# Patient Record
Sex: Female | Born: 1970 | Race: Black or African American | Hispanic: No | Marital: Single
Health system: Southern US, Community
[De-identification: ages and names within clinical notes are randomized; demographics above are authoritative.]

## PROBLEM LIST (undated history)

## (undated) DIAGNOSIS — I10 Essential (primary) hypertension: Secondary | ICD-10-CM

## (undated) HISTORY — PX: CHOLECYSTECTOMY: SHX55

## (undated) HISTORY — PX: TUBAL LIGATION: SHX77

---

## 2005-08-15 ENCOUNTER — Emergency Department (HOSPITAL_COMMUNITY): Admission: EM | Admit: 2005-08-15 | Discharge: 2005-08-15 | Payer: Self-pay | Admitting: Family Medicine

## 2005-08-16 ENCOUNTER — Ambulatory Visit: Payer: Self-pay | Admitting: Family Medicine

## 2005-09-06 ENCOUNTER — Ambulatory Visit: Payer: Self-pay | Admitting: Family Medicine

## 2006-04-14 ENCOUNTER — Emergency Department (HOSPITAL_COMMUNITY): Admission: EM | Admit: 2006-04-14 | Discharge: 2006-04-15 | Payer: Self-pay | Admitting: Emergency Medicine

## 2006-05-01 ENCOUNTER — Emergency Department (HOSPITAL_COMMUNITY): Admission: EM | Admit: 2006-05-01 | Discharge: 2006-05-01 | Payer: Self-pay | Admitting: Family Medicine

## 2006-12-06 ENCOUNTER — Emergency Department (HOSPITAL_COMMUNITY): Admission: EM | Admit: 2006-12-06 | Discharge: 2006-12-06 | Payer: Self-pay | Admitting: Emergency Medicine

## 2006-12-27 ENCOUNTER — Ambulatory Visit: Payer: Self-pay | Admitting: Family Medicine

## 2006-12-28 ENCOUNTER — Encounter (INDEPENDENT_AMBULATORY_CARE_PROVIDER_SITE_OTHER): Payer: Self-pay | Admitting: Family Medicine

## 2008-03-01 ENCOUNTER — Emergency Department (HOSPITAL_COMMUNITY): Admission: EM | Admit: 2008-03-01 | Discharge: 2008-03-01 | Payer: Self-pay | Admitting: Emergency Medicine

## 2008-05-20 ENCOUNTER — Emergency Department (HOSPITAL_COMMUNITY): Admission: EM | Admit: 2008-05-20 | Discharge: 2008-05-20 | Payer: Self-pay | Admitting: Emergency Medicine

## 2009-01-10 ENCOUNTER — Emergency Department: Payer: Self-pay | Admitting: Emergency Medicine

## 2009-05-24 ENCOUNTER — Ambulatory Visit: Payer: Self-pay | Admitting: Family Medicine

## 2009-08-28 ENCOUNTER — Emergency Department: Payer: Self-pay | Admitting: Emergency Medicine

## 2010-07-11 LAB — POCT I-STAT, CHEM 8
BUN: 6 mg/dL (ref 6–23)
Chloride: 105 mEq/L (ref 96–112)
Creatinine, Ser: 0.8 mg/dL (ref 0.4–1.2)
Hemoglobin: 13.6 g/dL (ref 12.0–15.0)
Potassium: 3.8 mEq/L (ref 3.5–5.1)
Sodium: 142 mEq/L (ref 135–145)

## 2010-07-13 ENCOUNTER — Emergency Department: Payer: Self-pay | Admitting: Internal Medicine

## 2010-12-29 LAB — URINALYSIS, ROUTINE W REFLEX MICROSCOPIC
Glucose, UA: NEGATIVE mg/dL
Hgb urine dipstick: NEGATIVE
Specific Gravity, Urine: 1.017 (ref 1.005–1.030)
Urobilinogen, UA: 0.2 mg/dL (ref 0.0–1.0)
pH: 7 (ref 5.0–8.0)

## 2011-04-04 ENCOUNTER — Encounter (HOSPITAL_COMMUNITY): Payer: Self-pay | Admitting: *Deleted

## 2011-04-04 HISTORY — DX: Essential (primary) hypertension: I10

## 2011-07-10 ENCOUNTER — Emergency Department (HOSPITAL_COMMUNITY): Payer: Medicaid Other

## 2011-07-10 ENCOUNTER — Emergency Department (HOSPITAL_COMMUNITY)
Admission: EM | Admit: 2011-07-10 | Discharge: 2011-07-10 | Disposition: A | Discharge status: Home / Self Care | Payer: Medicaid Other | Attending: Emergency Medicine | Admitting: Emergency Medicine

## 2011-07-10 ENCOUNTER — Encounter (HOSPITAL_COMMUNITY): Payer: Self-pay

## 2011-07-10 DIAGNOSIS — I10 Essential (primary) hypertension: Secondary | ICD-10-CM | POA: Insufficient documentation

## 2011-07-10 DIAGNOSIS — R51 Headache: Secondary | ICD-10-CM | POA: Insufficient documentation

## 2011-07-10 DIAGNOSIS — E7800 Pure hypercholesterolemia, unspecified: Secondary | ICD-10-CM | POA: Insufficient documentation

## 2011-07-10 DIAGNOSIS — G5600 Carpal tunnel syndrome, unspecified upper limb: Secondary | ICD-10-CM | POA: Insufficient documentation

## 2011-07-10 MED ORDER — DIPHENHYDRAMINE HCL 50 MG/ML IJ SOLN
25.0000 mg | Freq: Once | INTRAMUSCULAR | Status: AC
  Administered 2011-07-10: 25 mg via INTRAVENOUS
  Filled 2011-07-10: qty 1

## 2011-07-10 MED ORDER — DICLOFENAC SODIUM 75 MG PO TBEC: 75.0000 mg | DELAYED_RELEASE_TABLET | Freq: Two times a day (BID) | ORAL | Status: AC

## 2011-07-10 MED ORDER — DEXAMETHASONE SODIUM PHOSPHATE 10 MG/ML IJ SOLN
10.0000 mg | Freq: Once | INTRAMUSCULAR | Status: AC
  Administered 2011-07-10: 10 mg via INTRAVENOUS
  Filled 2011-07-10: qty 1

## 2011-07-10 MED ORDER — METOCLOPRAMIDE HCL 5 MG/ML IJ SOLN
10.0000 mg | Freq: Once | INTRAMUSCULAR | Status: AC
  Administered 2011-07-10: 10 mg via INTRAVENOUS
  Filled 2011-07-10: qty 2

## 2011-07-10 MED ORDER — KETOROLAC TROMETHAMINE 30 MG/ML IJ SOLN
30.0000 mg | Freq: Once | INTRAMUSCULAR | Status: AC
  Administered 2011-07-10: 30 mg via INTRAVENOUS
  Filled 2011-07-10: qty 1

## 2011-07-10 MED ORDER — SODIUM CHLORIDE 0.9 % IV BOLUS (SEPSIS)
1000.0000 mL | Freq: Once | INTRAVENOUS | Status: AC
  Administered 2011-07-10: 1000 mL via INTRAVENOUS

## 2011-07-10 NOTE — Progress Notes (Signed)
Orthopedic Tech Progress Note Patient Details:  Janet Casey [birth year] [id number]  Type of Splint: Other (comment) Splint Location: Left wrist Splint Interventions: Application    Janet Casey 07/10/2011, 10:16 AM

## 2011-07-10 NOTE — ED Provider Notes (Signed)
History     CSN: [id number]  Arrival date & time 07/10/11  0844   First MD Initiated Contact with Patient 07/10/11 0857      9:34 AM HPI Reports yesterday at work she began having a posterior headache. States HA was persistent all day. States this morning she still had a headache but it moved to the left frontal area. Describes pain as throbbing. Associated with hand and wrist pain and numbness. States she thinks her hand and wrist are carpal tunnel. 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 Reports wrist hand pain have on going. Denies neck pain, fever, weakness, aphasia, ataxia, sore throat, cough Patient is a 41 y.o. female presenting with headaches. The history is provided by the patient.  Headache  This is a new problem. The current episode started yesterday. The problem occurs constantly. The headache is associated with nothing. The pain is located in the frontal region. The quality of the pain is described as throbbing. The pain is severe. Pertinent negatives include no fever, no palpitations, no shortness of breath, no nausea and no vomiting. She has tried NSAIDs for the symptoms. The treatment provided no relief.    Past Medical History  Diagnosis Date  . Hypertension   . Hypercholesteremia     Past Surgical History  Procedure Date  . Cholecystectomy   . Tubal ligation     No family history on file.  History  Substance Use Topics  . Smoking status: Passive Smoker  . Smokeless tobacco: Not on file  . Alcohol Use: Yes     ocassional    OB History    Grav Para Term Preterm Abortions TAB SAB Ect Mult Living                  Review of Systems  Constitutional: Negative for fever and chills.  HENT: Negative for congestion, sore throat, rhinorrhea, trouble swallowing, neck pain, neck stiffness, postnasal drip and sinus pressure.   Respiratory: Negative for cough and shortness of breath.   Cardiovascular: Negative for palpitations.  Gastrointestinal: Negative  for nausea and vomiting.  Musculoskeletal: Negative for back pain.       Wrist and hand pain and numbness  Neurological: Positive for headaches. Negative for dizziness, seizures, speech difficulty, weakness, light-headedness and numbness.  All other systems reviewed and are negative.    Allergies  Review of patient's allergies indicates no known allergies.  Home Medications   Current Outpatient Rx  Name Route Sig Dispense Refill  . CARVEDILOL 6.25 MG PO TABS Oral Take 6.25 mg by mouth 2 (two) times daily with a meal.    . TRIAMTERENE-HCTZ 37.5-25 MG PO TABS Oral Take 1 tablet by mouth daily.      BP 156/90  Pulse 66  Temp(Src) 97.6 F (36.4 C) (Oral)  Resp 18  SpO2 99%  Physical Exam  Vitals reviewed. Constitutional: She is oriented to person, place, and time. Vital signs are normal. She appears well-developed and well-nourished.  HENT:  Head: Normocephalic and atraumatic.  Eyes: Conjunctivae are normal. Pupils are equal, round, and reactive to light.  Neck: Normal range of motion. Neck supple.  Cardiovascular: Normal rate, regular rhythm and normal heart sounds.  Exam reveals no friction rub.   No murmur heard. Pulmonary/Chest: Effort normal and breath sounds normal. She has no wheezes. She has no rhonchi. She has no rales. She exhibits no tenderness.  Musculoskeletal: Normal range of motion.       Left wrist: She exhibits  normal range of motion, no tenderness, no bony tenderness, no swelling, no effusion, no crepitus, no deformity and no laceration.       Patient has a positive Phalen's and Tinel's test. Cap refill <2 seconds  Neurological: She is alert and oriented to person, place, and time. No cranial nerve deficit. She exhibits normal muscle tone. Coordination normal.  Skin: Skin is warm and dry. No rash noted. No erythema. No pallor.    ED Course  Procedures  Results for orders placed during the hospital encounter of 05/20/08  POCT I-STAT, CHEM 8      Component  Value Range   Sodium 142  135 - 145 (mEq/L)   Potassium 3.8  3.5 - 5.1 (mEq/L)   Chloride 105  96 - 112 (mEq/L)   BUN 6  6 - 23 (mg/dL)   Creatinine, Ser 0.8  0.4 - 1.2 (mg/dL)   Glucose, Bld 95  70 - 99 (mg/dL)   Calcium, Ion 1.61  0.96 - 1.32 (mmol/L)   TCO2 27  0 - 100 (mmol/L)   Hemoglobin 13.6  12.0 - 15.0 (g/dL)   HCT 04.5  40.9 - 81.1 (%)   Ct Head Wo Contrast  07/10/2011  *RADIOLOGY REPORT*  Clinical Data: Headache.  Numbness in the left hand.  CT HEAD WITHOUT CONTRAST  Technique:  Contiguous axial images were obtained from the base of the skull through the vertex without contrast.  Comparison: None.  Findings: The brain stem, cerebellum, cerebral peduncles, thalami, basal ganglia, basilar cisterns, and ventricular system appear unremarkable.  No intracranial hemorrhage, mass lesion, or acute infarction is identified.  IMPRESSION:  No significant abnormality identified.  Original Report Authenticated By: Dellia Cloud, M.D.     MDM  Pt's pain is a 0/10 now Ready for d/c        Thomasene Lot, PA-C 07/10/11 1132

## 2011-07-10 NOTE — ED Notes (Signed)
Pt complains of headache and then numbness in left had, sts may be migraine or bp.

## 2011-07-10 NOTE — ED Notes (Signed)
PA back at bedside.

## 2011-07-10 NOTE — ED Notes (Signed)
PA at bedside.

## 2011-07-10 NOTE — Discharge Instructions (Signed)
Headache, General, Unknown Cause The specific cause of your headache may not have been found today. There are many causes and types of headache. A few common ones are:  Tension headache.   Migraine.   Infections (examples: dental and sinus infections).   Bone and/or joint problems in the neck or jaw.   Depression.   Eye problems.  These headaches are not life threatening.  Headaches can sometimes be diagnosed by a patient history and a physical exam. Sometimes, lab and imaging studies (such as x-ray and/or CT scan) are used to rule out more serious problems. In some cases, a spinal tap (lumbar puncture) may be requested. There are many times when your exam and tests may be normal on the first visit even when there is a serious problem causing your headaches. Because of that, it is very important to follow up with your doctor or local clinic for further evaluation. FINDING OUT THE RESULTS OF TESTS  If a radiology test was performed, a radiologist will review your results.   You will be contacted by the emergency department or your physician if any test results require a change in your treatment plan.   Not all test results may be available during your visit. If your test results are not back during the visit, make an appointment with your caregiver to find out the results. Do not assume everything is normal if you have not heard from your caregiver or the medical facility. It is important for you to follow up on all of your test results.  HOME CARE INSTRUCTIONS   Keep follow-up appointments with your caregiver, or any specialist referral.   Only take over-the-counter or prescription medicines for pain, discomfort, or fever as directed by your caregiver.   Biofeedback, massage, or other relaxation techniques may be helpful.   Ice packs or heat applied to the head and neck can be used. Do this three to four times per day, or as needed.   Call your doctor if you have any questions or  concerns.   If you smoke, you should quit.  SEEK MEDICAL CARE IF:   You develop problems with medications prescribed.   You do not respond to or obtain relief from medications.   You have a change from the usual headache.   You develop nausea or vomiting.  SEEK IMMEDIATE MEDICAL CARE IF:   If your headache becomes severe.   You have an unexplained oral temperature above 102 F (38.9 C), or as your caregiver suggests.   You have a stiff neck.   You have loss of vision.   You have muscular weakness.   You have loss of muscular control.   You develop severe symptoms different from your first symptoms.   You start losing your balance or have trouble walking.   You feel faint or pass out.  MAKE SURE YOU:   Understand these instructions.   Will watch your condition.   Will get help right away if you are not doing well or get worse.  Document Released: 03/12/2005 Document Revised: 03/01/2011 Document Reviewed: 10/30/2007 [facility] Patient Information 2012 [place], [place].Carpal Tunnel Syndrome The carpal tunnel is a narrow hollow area in the wrist. It is formed by the wrist bones and ligaments. Nerves, blood vessels, and tendons (cord like structures which attach muscle to bone) on the palm side (the side of your hand in the direction your fingers bend) of your hand pass through the carpal tunnel. Repeated wrist motion or certain diseases may  cause swelling within the tunnel. (That is why these are called repetitive trauma (damage caused by over use) disorders. It is also a common problem in late pregnancy.) This swelling pinches the main nerve in the wrist (median nerve) and causes the painful condition called carpal tunnel syndrome. A feeling of "pins and needles" may be noticed in the fingers or hand; however, the entire arm may ache from this condition. Carpal tunnel syndrome may clear up by itself. Cortisone injections may help. Sometimes, an operation may be needed to free the  pinched nerve. An electromyogram (a type of test) may be needed to confirm this diagnosis (learning what is wrong). This is a test which measures nerve conduction. The nerve conduction is usually slowed in a carpal tunnel syndrome. HOME CARE INSTRUCTIONS   If your caregiver prescribed medication to help reduce swelling, take as directed.   If you were given a splint to keep your wrist from bending, use it as instructed. It is important to wear the splint at night. Use the splint for as long as you have pain or numbness in your hand, arm or wrist. This may take 1 to 2 months.   If you have pain at night, it may help to rub or shake your hand, or elevate your hand above the level of your heart (the center of your chest).   It is important to give your wrist a rest by stopping the activities that are causing the problem. If your symptoms (problems) are work-related, you may need to talk to your employer about changing to a job that does not require using your wrist.   Only take over-the-counter or prescription medicines for pain, discomfort, or fever as directed by your caregiver.   Following periods of extended use, particularly strenuous use, apply an ice pack wrapped in a towel to the anterior (palm) side of the affected wrist for 20 to 30 minutes. Repeat as needed three to four times per day. This will help reduce the swelling.   Follow all instructions for follow-up with your caregiver. This includes any orthopedic referrals, physical therapy, and rehabilitation. Any delay in obtaining necessary care could result in a delay or failure of your condition to heal.  SEEK IMMEDIATE MEDICAL CARE IF:   You are still having pain and numbness following a week of treatment.   You develop new, unexplained symptoms.   Your current symptoms are getting worse and are not helped or controlled with medications.  MAKE SURE YOU:   Understand these instructions.   Will watch your condition.   Will get  help right away if you are not doing well or get worse.  Document Released: 03/09/2000 Document Revised: 03/01/2011 Document Reviewed: 01/26/2011 [facility] Patient Information 2012 [place], [place].

## 2011-07-13 NOTE — ED Provider Notes (Signed)
Medical screening examination/treatment/procedure(s) were conducted as a shared visit with non-physician practitioner(s) and myself.  I personally evaluated the patient during the encounter.  Headache without neuro deficits or stiff neck  Donnetta Hutching, MD 07/13/11 1423

## 2012-08-06 ENCOUNTER — Emergency Department (HOSPITAL_COMMUNITY)
Admission: EM | Admit: 2012-08-06 | Discharge: 2012-08-06 | Disposition: A | Discharge status: Home / Self Care | Payer: Medicaid Other | Attending: Emergency Medicine | Admitting: Emergency Medicine

## 2012-08-06 ENCOUNTER — Emergency Department (HOSPITAL_COMMUNITY): Payer: Medicaid Other

## 2012-08-06 ENCOUNTER — Encounter (HOSPITAL_COMMUNITY): Payer: Self-pay | Admitting: Emergency Medicine

## 2012-08-06 DIAGNOSIS — S8990XA Unspecified injury of unspecified lower leg, initial encounter: Secondary | ICD-10-CM | POA: Insufficient documentation

## 2012-08-06 DIAGNOSIS — Y9301 Activity, walking, marching and hiking: Secondary | ICD-10-CM | POA: Insufficient documentation

## 2012-08-06 DIAGNOSIS — M25561 Pain in right knee: Secondary | ICD-10-CM

## 2012-08-06 DIAGNOSIS — Y929 Unspecified place or not applicable: Secondary | ICD-10-CM | POA: Insufficient documentation

## 2012-08-06 DIAGNOSIS — X500XXA Overexertion from strenuous movement or load, initial encounter: Secondary | ICD-10-CM | POA: Insufficient documentation

## 2012-08-06 DIAGNOSIS — I10 Essential (primary) hypertension: Secondary | ICD-10-CM | POA: Insufficient documentation

## 2012-08-06 DIAGNOSIS — Z79899 Other long term (current) drug therapy: Secondary | ICD-10-CM | POA: Insufficient documentation

## 2012-08-06 DIAGNOSIS — S99929A Unspecified injury of unspecified foot, initial encounter: Secondary | ICD-10-CM | POA: Insufficient documentation

## 2012-08-06 MED ORDER — IBUPROFEN 800 MG PO TABS: 800.0000 mg | ORAL_TABLET | Freq: Three times a day (TID) | ORAL | Status: DC

## 2012-08-06 MED ORDER — OXYCODONE-ACETAMINOPHEN 5-325 MG PO TABS: ORAL_TABLET | ORAL | Status: DC

## 2012-08-06 NOTE — ED Notes (Signed)
BIB self. C/o walking dogs 2-3 weeks ago, dogs had pulled in a lateral direction against right knee. Patient states she thinks knee "popped" at that time. Patient has been worsening recently. Numbness to lateral right thigh. Intact sensation to right toes. Patient ambulated to triage. No other complaints

## 2012-08-06 NOTE — ED Provider Notes (Signed)
History    This chart was scribed for non-physician practitioner Junius Finner working with Celene Kras, MD by Quintella Reichert, ED Scribe. This patient was seen in room TR07C/TR07C and the patient's care was started at 12:00 AM .   CSN: [id number]  Arrival date & time 08/06/12  1645      Chief Complaint  Patient presents with  . Knee Pain     The history is provided by the patient. No language interpreter was used.    HPI Comments: Janet Casey is a 42 y.o. female who presents to the Emergency Department complaining of constant, moderate right knee pain that began 3 weeks ago when pt was walking her dogs and they pulled her in 2 different directions.  Pt describes pain in knee as sharp and aching, and states it is exacerbated by bearing weight and by bending the knee.  It is not relieved by elevation or ibuprofen.  Pt is ambulatory with pain.  She denies past h/o injury or pain to the area.  Pt also notes pain in the muscles behind her right knee that began more recently, extending from her thigh down to the middle of her calf.  Pt states she is allergic to Vicodin and tramadol (make her jittery).  She states that she is not allergic to Percocet.   Past Medical History  Diagnosis Date  . Hypertension     History reviewed. No pertinent past surgical history.  History reviewed. No pertinent family history.  History  Substance Use Topics  . Smoking status: Passive Smoke Exposure - Never Smoker  . Smokeless tobacco: Not on file  . Alcohol Use: Yes     Comment: ocassional    OB History   Grav Para Term Preterm Abortions TAB SAB Ect Mult Living                  Review of Systems  All other systems reviewed and are negative.    Allergies  Tramadol and Vicodin  Home Medications   Current Outpatient Rx  Name  Route  Sig  Dispense  Refill  . carvedilol (COREG) 6.25 MG tablet   Oral   Take 6.25 mg by mouth 2 (two) times daily with a meal.         .  triamterene-hydrochlorothiazide (MAXZIDE-25) 37.5-25 MG per tablet   Oral   Take 1 tablet by mouth daily.         Marland Kitchen ibuprofen (ADVIL,MOTRIN) 800 MG tablet   Oral   Take 1 tablet (800 mg total) by mouth 3 (three) times daily.   21 tablet   0   . oxyCODONE-acetaminophen (PERCOCET/ROXICET) 5-325 MG per tablet      Take 1-2 pills every 4-6 hours as needed for pain   6 tablet   0     BP 151/84  Pulse 89  Temp(Src) 99.2 F (37.3 C)  Resp 17  SpO2 98%  LMP 07/16/2012  Physical Exam  Nursing note and vitals reviewed. Constitutional: She is oriented to person, place, and time. She appears well-developed and well-nourished. No distress.  HENT:  Head: Normocephalic and atraumatic.  Eyes: Conjunctivae and EOM are normal.  Neck: Normal range of motion. Neck supple. No tracheal deviation present.  Cardiovascular: Normal rate, regular rhythm and normal heart sounds.   No murmur heard. Pulmonary/Chest: Effort normal and breath sounds normal. No respiratory distress. She has no wheezes. She has no rales.  Abdominal: Soft. There is no tenderness.  Musculoskeletal: Normal range  of motion. She exhibits edema (Mild) and tenderness ( moderate).  Right knee: mild edema, tenderness to palpation on medial and lateral aspects of knee.   Pain with weight-bearing.  Flexion limited by pain.  Neurological: She is alert and oriented to person, place, and time. Coordination normal.  Skin: Skin is warm and dry. No rash noted.  Psychiatric: She has a normal mood and affect. Her behavior is normal.    ED Course  Procedures (including critical care time)  DIAGNOSTIC STUDIES: Oxygen Saturation is 98% on room air, normal by my interpretation.    COORDINATION OF CARE: 7:03 PM-Explained that x-ray ruled out fracture.  Discussed treatment plan which includes knee immobilization, crutches and f/u with orthopedist with pt at bedside and pt agreed to plan.      Labs Reviewed - No data to display Dg  Knee Complete 4 Views Right  08/06/2012   *RADIOLOGY REPORT*  Clinical Data: Knee pain.  Swelling.  RIGHT KNEE - COMPLETE 4+ VIEW  Comparison: None  Findings: Early degenerative spurring within the patellofemoral and medial compartments.  Joint spaces are maintained. No acute bony abnormality.  Specifically, no fracture, subluxation, or dislocation.  Soft tissues are intact.  No joint effusion.  IMPRESSION: No acute bony abnormality.   Original Report Authenticated By: Charlett Nose, M.D.     1. Right knee pain       MDM  Pt c/o right knee pain x2-3 weeks after having being pulled by dogs & hearing a "pop"  Pain has not improved over this time.  Has not been seen for this pain before.  PE: mild edema, moderate tenderness over medial and lateral aspects of right knee.  No warmth or erythema. Skin in tact.  Nl plain films.   Pain not relieved by ibuprofen and elevation.  Rx: percocet, ibuprofen, knee imobolizer and crutches.  Pt states she does have insurance.  Will have pt f/u with [facility] for continued knee pain.  May need further evaluation and management.   I personally performed the services described in this documentation, which was scribed in my presence. The recorded information has been reviewed and is accurate.    Junius Finner, PA-C 08/07/12 0005

## 2012-08-06 NOTE — ED Notes (Signed)
Patient discharged with instructions using the teach back method. Patient verbalizes an understanding. 

## 2012-08-07 NOTE — ED Provider Notes (Signed)
Medical screening examination/treatment/procedure(s) were performed by non-physician practitioner and as supervising physician I was immediately available for consultation/collaboration.    Ival Basquez R Brently Voorhis, MD 08/07/12 1534 

## 2012-12-03 ENCOUNTER — Emergency Department: Payer: Self-pay | Admitting: Emergency Medicine

## 2013-04-20 ENCOUNTER — Emergency Department: Payer: Self-pay | Admitting: Emergency Medicine

## 2013-04-20 LAB — CBC
HCT: 42.5 % (ref 35.0–47.0)
HGB: 13.4 g/dL (ref 12.0–16.0)
MCH: 24.6 pg — ABNORMAL LOW (ref 26.0–34.0)
MCHC: 31.6 g/dL — ABNORMAL LOW (ref 32.0–36.0)
MCV: 78 fL — AB (ref 80–100)
Platelet: 251 10*3/uL (ref 150–440)
RBC: 5.46 10*6/uL — ABNORMAL HIGH (ref 3.80–5.20)
RDW: 14.5 % (ref 11.5–14.5)
WBC: 6.3 10*3/uL (ref 3.6–11.0)

## 2013-04-20 LAB — TROPONIN I: Troponin-I: <0.02 ng/mL

## 2013-07-06 ENCOUNTER — Emergency Department (HOSPITAL_COMMUNITY)
Admission: EM | Admit: 2013-07-06 | Discharge: 2013-07-06 | Disposition: A | Discharge status: Home / Self Care | Payer: Medicaid Other | Attending: Emergency Medicine | Admitting: Emergency Medicine

## 2013-07-06 ENCOUNTER — Emergency Department (HOSPITAL_COMMUNITY): Payer: Medicaid Other

## 2013-07-06 ENCOUNTER — Encounter (HOSPITAL_COMMUNITY): Payer: Self-pay | Admitting: Emergency Medicine

## 2013-07-06 DIAGNOSIS — Z791 Long term (current) use of non-steroidal anti-inflammatories (NSAID): Secondary | ICD-10-CM | POA: Insufficient documentation

## 2013-07-06 DIAGNOSIS — W010XXA Fall on same level from slipping, tripping and stumbling without subsequent striking against object, initial encounter: Secondary | ICD-10-CM | POA: Insufficient documentation

## 2013-07-06 DIAGNOSIS — I10 Essential (primary) hypertension: Secondary | ICD-10-CM | POA: Insufficient documentation

## 2013-07-06 DIAGNOSIS — Y929 Unspecified place or not applicable: Secondary | ICD-10-CM | POA: Insufficient documentation

## 2013-07-06 DIAGNOSIS — S92502A Displaced unspecified fracture of left lesser toe(s), initial encounter for closed fracture: Secondary | ICD-10-CM

## 2013-07-06 DIAGNOSIS — S92919A Unspecified fracture of unspecified toe(s), initial encounter for closed fracture: Secondary | ICD-10-CM | POA: Insufficient documentation

## 2013-07-06 DIAGNOSIS — Y939 Activity, unspecified: Secondary | ICD-10-CM | POA: Insufficient documentation

## 2013-07-06 DIAGNOSIS — IMO0002 Reserved for concepts with insufficient information to code with codable children: Secondary | ICD-10-CM | POA: Insufficient documentation

## 2013-07-06 DIAGNOSIS — Z79899 Other long term (current) drug therapy: Secondary | ICD-10-CM | POA: Insufficient documentation

## 2013-07-06 MED ORDER — OXYCODONE-ACETAMINOPHEN 5-325 MG PO TABS: 2.0000 | ORAL_TABLET | ORAL | Status: DC | PRN

## 2013-07-06 MED ORDER — CLINDAMYCIN HCL 150 MG PO CAPS: 300.0000 mg | ORAL_CAPSULE | Freq: Three times a day (TID) | ORAL | Status: DC

## 2013-07-06 MED ORDER — OXYCODONE-ACETAMINOPHEN 5-325 MG PO TABS
2.0000 | ORAL_TABLET | Freq: Once | ORAL | Status: AC
  Administered 2013-07-06: 2 via ORAL
  Filled 2013-07-06: qty 2

## 2013-07-06 NOTE — Discharge Instructions (Signed)
Take Clindamycin as directed until gone. Take Vicodin as needed for pain. Refer to attached documents for more information. Apply ice and elevate your left foot.

## 2013-07-06 NOTE — ED Provider Notes (Signed)
CSN: [id number]     Arrival date & time 07/06/13  1207 History   First MD Initiated Contact with Patient 07/06/13 1252     Chief Complaint  Patient presents with  . Toe Injury     (Consider location/radiation/quality/duration/timing/severity/associated sxs/prior Treatment) Patient is a 43 y.o. female presenting with toe pain. The history is provided by the patient. No language interpreter was used.  Toe Pain This is a new problem. The current episode started today. The problem occurs constantly. The problem has been unchanged. Associated symptoms include arthralgias. Pertinent negatives include no abdominal pain, chest pain, chills, fatigue, fever, nausea, neck pain, vomiting or weakness. The symptoms are aggravated by walking. She has tried nothing for the symptoms. The treatment provided no relief.    Past Medical History  Diagnosis Date  . Hypertension    History reviewed. No pertinent past surgical history. History reviewed. No pertinent family history. History  Substance Use Topics  . Smoking status: Passive Smoke Exposure - Never Smoker  . Smokeless tobacco: Not on file  . Alcohol Use: Yes     Comment: ocassional   OB History   Grav Para Term Preterm Abortions TAB SAB Ect Mult Living                 Review of Systems  Constitutional: Negative for fever, chills and fatigue.  HENT: Negative for trouble swallowing.   Eyes: Negative for visual disturbance.  Respiratory: Negative for shortness of breath.   Cardiovascular: Negative for chest pain and palpitations.  Gastrointestinal: Negative for nausea, vomiting, abdominal pain and diarrhea.  Genitourinary: Negative for dysuria and difficulty urinating.  Musculoskeletal: Positive for arthralgias. Negative for neck pain.  Skin: Negative for color change.  Neurological: Negative for dizziness and weakness.  Psychiatric/Behavioral: Negative for dysphoric mood.      Allergies  Tramadol and Vicodin  Home Medications    Current Outpatient Rx  Name  Route  Sig  Dispense  Refill  . carvedilol (COREG) 6.25 MG tablet   Oral   Take 6.25 mg by mouth 2 (two) times daily with a meal.         . ibuprofen (ADVIL,MOTRIN) 800 MG tablet   Oral   Take 1 tablet (800 mg total) by mouth 3 (three) times daily.   21 tablet   0   . oxyCODONE-acetaminophen (PERCOCET/ROXICET) 5-325 MG per tablet      Take 1-2 pills every 4-6 hours as needed for pain   6 tablet   0   . triamterene-hydrochlorothiazide (MAXZIDE-25) 37.5-25 MG per tablet   Oral   Take 1 tablet by mouth daily.          BP 193/98  Pulse 88  Temp(Src) 98.3 F (36.8 C)  Resp 18  Ht 5\' 4"  (1.626 m)  Wt 226 lb 7 oz (102.711 kg)  BMI 38.85 kg/m2  SpO2 99% Physical Exam  Nursing note and vitals reviewed. Constitutional: She appears well-developed and well-nourished. No distress.  HENT:  Head: Normocephalic and atraumatic.  Eyes: Conjunctivae are normal.  Neck: Normal range of motion.  Cardiovascular: Normal rate and regular rhythm.  Exam reveals no gallop and no friction rub.   No murmur heard. Pulmonary/Chest: Effort normal and breath sounds normal. She has no wheezes. She has no rales. She exhibits no tenderness.  Musculoskeletal:  Limited ROM of left second toe due to pain and swelling. No obvious deformity. Tenderness to palpation. Bruising and overlying abrasion noted.   Neurological: She is alert.  Speech is goal-oriented. Moves limbs without ataxia.   Skin: Skin is warm and dry.  Abrasion noted to left second toe.     ED Course  Procedures (including critical care time) Labs Review Labs Reviewed - No data to display Imaging Review Dg Toe 2nd Left  07/06/2013   CLINICAL DATA:  Toe injury  EXAM: LEFT SECOND TOE  COMPARISON:  None.  FINDINGS: Cortical irregularity medial base of the head of proximal phalanx second digit. Otherwise no further evidence of fracture nor dislocation. There are findings which may represent soft tissue  swelling within the second digit.  IMPRESSION: Mach line versus nondisplaced fracture head of the proximal phalanx second digit. Correlation with physical exam, point tenderness is recommended.   Electronically Signed   By: Salome HolmesHector  Cooper M.D.   On: 07/06/2013 13:05     EKG Interpretation None      MDM   Final diagnoses:  Fracture of second toe, left, closed    1:22 PM Xray shows nondisplaced fracture of head of proximal phalanx of second digit, which correlates clinically. Patient will have vicodin for pain. Patient will have antibiotics to prevent infection due to abrasion. Vitals stable and patient afebrile.     Emilia BeckKaitlyn Rion Schnitzer, PA-C 07/06/13 1520

## 2013-07-06 NOTE — ED Provider Notes (Signed)
Medical screening examination/treatment/procedure(s) were performed by non-physician practitioner and as supervising physician I was immediately available for consultation/collaboration.  Gerhard Munchobert Ameya Vowell, MD 07/06/13 1606

## 2013-07-06 NOTE — ED Notes (Addendum)
Per pt sts she tripped over something last night in the dark and injured left second toe. sts the toe is swollen and black/blue.

## 2013-07-06 NOTE — ED Notes (Signed)
Accidentally kicked a steel object with left foot. Left second toe swollen and discolored.

## 2013-07-15 ENCOUNTER — Encounter (HOSPITAL_COMMUNITY): Payer: Self-pay | Admitting: Emergency Medicine

## 2013-07-15 ENCOUNTER — Emergency Department (HOSPITAL_COMMUNITY)
Admission: EM | Admit: 2013-07-15 | Discharge: 2013-07-15 | Disposition: A | Discharge status: Home / Self Care | Payer: Medicaid Other | Attending: Emergency Medicine | Admitting: Emergency Medicine

## 2013-07-15 DIAGNOSIS — G8911 Acute pain due to trauma: Secondary | ICD-10-CM | POA: Insufficient documentation

## 2013-07-15 DIAGNOSIS — I10 Essential (primary) hypertension: Secondary | ICD-10-CM | POA: Insufficient documentation

## 2013-07-15 DIAGNOSIS — Z792 Long term (current) use of antibiotics: Secondary | ICD-10-CM | POA: Insufficient documentation

## 2013-07-15 DIAGNOSIS — Z79899 Other long term (current) drug therapy: Secondary | ICD-10-CM | POA: Insufficient documentation

## 2013-07-15 DIAGNOSIS — M79609 Pain in unspecified limb: Secondary | ICD-10-CM | POA: Insufficient documentation

## 2013-07-15 DIAGNOSIS — S92912A Unspecified fracture of left toe(s), initial encounter for closed fracture: Secondary | ICD-10-CM

## 2013-07-15 NOTE — ED Notes (Signed)
Pt presents to department for evaluation of L second toe pain. States continued pain and swelling. States she is unable to work and needs excuse note. Pt is alert and oriented x4. 5/10 pain at present. NAD.

## 2013-07-15 NOTE — ED Provider Notes (Signed)
Medical screening examination/treatment/procedure(s) were performed by non-physician practitioner and as supervising physician I was immediately available for consultation/collaboration.   EKG Interpretation None        Kaliq Lege M Yarah Fuente, DO 07/15/13 1910 

## 2013-07-15 NOTE — ED Provider Notes (Signed)
CSN: [id number]     Arrival date & time 07/15/13  1036 History  This chart was scribed for non-physician practitioner working with Janet BeckKaitlyn Kahlil Casey, by Tana ConchStephen Methvin ED Scribe. This patient was seen in TR05C/TR05C and the patient's care was started at 11:39 AM.    Chief Complaint  Patient presents with  . Toe Pain      Patient is a 43 y.o. female presenting with toe pain. The history is provided by the patient. No language interpreter was used.  Toe Pain   HPI Comments: Janet Casey is a 43 y.o. female who presents to the Emergency Department complaining of left second toe pain, she ws seen in the ED recently for this injury. She reports that her foot and ankle swell when she stands on it.  She is worried that being at work, where she has to be on her feet for 12 hours, will aggravate her left foot. She wants a work note for the next few days.   Past Medical History  Diagnosis Date  . Hypertension    History reviewed. No pertinent past surgical history. History reviewed. No pertinent family history. History  Substance Use Topics  . Smoking status: Passive Smoke Exposure - Never Smoker  . Smokeless tobacco: Not on file  . Alcohol Use: Yes     Comment: social   OB History   Grav Para Term Preterm Abortions TAB SAB Ect Mult Living                 Review of Systems  Constitutional: Positive for activity change.  Musculoskeletal: Positive for arthralgias and joint swelling.       Left toe   All other systems reviewed and are negative.     Allergies  Tramadol and Vicodin  Home Medications   Prior to Admission medications   Medication Sig Start Date End Date Taking? Authorizing Provider  carvedilol (COREG) 6.25 MG tablet Take 12.5 mg by mouth daily.     Historical Provider, MD  clindamycin (CLEOCIN) 150 MG capsule Take 2 capsules (300 mg total) by mouth 3 (three) times daily. May dispense as 150mg  capsules 07/06/13   Janet BeckKaitlyn Dock Baccam, PA-C  oxyCODONE-acetaminophen  (PERCOCET/ROXICET) 5-325 MG per tablet Take 2 tablets by mouth every 4 (four) hours as needed for severe pain. 07/06/13   Dela Sweeny, PA-C  triamterene-hydrochlorothiazide (MAXZIDE-25) 37.5-25 MG per tablet Take 1 tablet by mouth daily.    Historical Provider, MD   BP 175/105  Pulse 77  Temp(Src) 98.6 F (37 C) (Oral)  Resp 18  SpO2 100% Physical Exam  Nursing note and vitals reviewed. Constitutional: She is oriented to person, place, and time. She appears well-developed and well-nourished.  HENT:  Head: Normocephalic and atraumatic.  Eyes: Pupils are equal, round, and reactive to light.  Neck: Normal range of motion.  Pulmonary/Chest: Effort normal.  Musculoskeletal: Normal range of motion.  Pt left foot in post-op shoe  Neurological: She is alert and oriented to person, place, and time.    ED Course  Procedures (including critical care time) DIAGNOSTIC STUDIES: Oxygen Saturation is 100% on RA, normal by my interpretation.    COORDINATION OF CARE:   11:02 AM-Discussed treatment plan which includes a work note with pt at bedside and pt agreed to plan.   Labs Review Labs Reviewed - No data to display  Imaging Review No results found.   EKG Interpretation None      MDM   Final diagnoses:  Closed fracture of left toe  I personally performed the services described in this documentation, which was scribed in my presence. The recorded information has been reviewed and is accurate.   11:46 AM Patient needs a note for work because she cannot put her foot in steel toe boots without pain. Patient will have a note for additional days off. No new injuries.     Janet BeckKaitlyn Brookelle Pellicane, PA-C 07/15/13 1147

## 2013-07-15 NOTE — ED Notes (Signed)
Pt was here 4/13 with fx'd metatarsal. States has tried to go back to work but they want her to wear steel toed shoes at work and she is unable.

## 2013-09-18 ENCOUNTER — Emergency Department (HOSPITAL_COMMUNITY)
Admission: EM | Admit: 2013-09-18 | Discharge: 2013-09-18 | Disposition: A | Discharge status: Home / Self Care | Payer: Medicaid Other | Attending: Emergency Medicine | Admitting: Emergency Medicine

## 2013-09-18 ENCOUNTER — Emergency Department (HOSPITAL_COMMUNITY): Payer: Medicaid Other

## 2013-09-18 ENCOUNTER — Encounter (HOSPITAL_COMMUNITY): Payer: Self-pay | Admitting: Emergency Medicine

## 2013-09-18 ENCOUNTER — Emergency Department (HOSPITAL_COMMUNITY)
Admission: EM | Admit: 2013-09-18 | Discharge: 2013-09-18 | Disposition: A | Discharge status: Home / Self Care | Payer: No Typology Code available for payment source | Source: Home / Self Care

## 2013-09-18 DIAGNOSIS — I10 Essential (primary) hypertension: Secondary | ICD-10-CM | POA: Insufficient documentation

## 2013-09-18 DIAGNOSIS — IMO0002 Reserved for concepts with insufficient information to code with codable children: Secondary | ICD-10-CM | POA: Insufficient documentation

## 2013-09-18 DIAGNOSIS — Z79899 Other long term (current) drug therapy: Secondary | ICD-10-CM | POA: Insufficient documentation

## 2013-09-18 DIAGNOSIS — Z792 Long term (current) use of antibiotics: Secondary | ICD-10-CM | POA: Insufficient documentation

## 2013-09-18 DIAGNOSIS — R0789 Other chest pain: Secondary | ICD-10-CM | POA: Insufficient documentation

## 2013-09-18 DIAGNOSIS — R079 Chest pain, unspecified: Secondary | ICD-10-CM | POA: Insufficient documentation

## 2013-09-18 LAB — BASIC METABOLIC PANEL
BUN: 10 mg/dL (ref 6–23)
CHLORIDE: 101 meq/L (ref 96–112)
CO2: 28 mEq/L (ref 19–32)
Calcium: 9.3 mg/dL (ref 8.4–10.5)
Creatinine, Ser: 0.84 mg/dL (ref 0.50–1.10)
GFR calc Af Amer: >90 mL/min (ref >90)
GFR, EST NON AFRICAN AMERICAN: 84 mL/min — AB (ref >90)
GLUCOSE: 134 mg/dL — AB (ref 70–99)
POTASSIUM: 3.3 meq/L — AB (ref 3.7–5.3)
SODIUM: 142 meq/L (ref 137–147)

## 2013-09-18 LAB — CBC
HEMATOCRIT: 38.6 % (ref 36.0–46.0)
HEMOGLOBIN: 12.2 g/dL (ref 12.0–15.0)
MCH: 25.3 pg — ABNORMAL LOW (ref 26.0–34.0)
MCHC: 31.6 g/dL (ref 30.0–36.0)
MCV: 79.9 fL (ref 78.0–100.0)
Platelets: 248 10*3/uL (ref 150–400)
RBC: 4.83 MIL/uL (ref 3.87–5.11)
RDW: 14.3 % (ref 11.5–15.5)
WBC: 5.7 10*3/uL (ref 4.0–10.5)

## 2013-09-18 LAB — I-STAT TROPONIN, ED
Troponin i, poc: 0.01 ng/mL (ref 0.00–0.08)
Troponin i, poc: 0.01 ng/mL (ref 0.00–0.08)

## 2013-09-18 MED ORDER — POTASSIUM CHLORIDE CRYS ER 20 MEQ PO TBCR
40.0000 meq | EXTENDED_RELEASE_TABLET | Freq: Once | ORAL | Status: AC
  Administered 2013-09-18: 40 meq via ORAL
  Filled 2013-09-18: qty 2

## 2013-09-18 NOTE — ED Provider Notes (Signed)
Date: 09/18/2013  Rate: 72  Rhythm: normal sinus rhythm  QRS Axis: left  Intervals: normal  ST/T Wave abnormalities: nonspecific T wave changes  Conduction Disutrbances:none  Narrative Interpretation: T wave inversion lead III, v4  Old EKG Reviewed: none available    Glynn OctaveStephen Bessie Boyte, MD 09/18/13 [phone]

## 2013-09-18 NOTE — ED Notes (Addendum)
Wrong patient information entered, previous note was not for this patient

## 2013-09-18 NOTE — ED Notes (Signed)
Pt in c/o central chest pain over the last few days, states pain is intermittent and will have occasional palpitations, denies other symptoms or radiation, states she had pain this morning en route to hospital but denies pain at this time, no distress noted

## 2013-09-18 NOTE — ED Provider Notes (Signed)
Medical screening examination/treatment/procedure(s) were conducted as a shared visit with non-physician practitioner(s) and myself.  I personally evaluated the patient during the encounter.  Intermittent chest pressure since yesterday, lasting a few minutes at a time. No radiation.  No SOB, diaphoresis, vomiting. TTP on sternum which reproduces pain. EKG with nonspecific T wave inversions.  Atypical for ACS or PE.   EKG Interpretation   Date/Time:  Friday September 18 2013 07:39:15 EDT Ventricular Rate:  72 PR Interval:  174 QRS Duration: 84 QT Interval:  408 QTC Calculation: 446 R Axis:   -27 Text Interpretation:  Normal sinus rhythm Minimal voltage criteria for  LVH, may be normal variant Nonspecific T wave abnormality Abnormal ECG T  wave inverion III, v4 No previous ECGs available Confirmed by Manus GunningANCOUR  MD,  STEPHEN [id number]) on 09/18/2013 9:02:55 AM       Glynn OctaveStephen Rancour, MD 09/18/13 [id number]

## 2013-09-18 NOTE — Discharge Instructions (Signed)
Please follow up with your primary care physician in 1-2 days. If you do not have one please [facility] listed above. Please follow up with Dr. Daleen SquibbWall to schedule a follow up appointment.  Please read all discharge instructions and return precautions.    Chest Pain (Nonspecific) It is often hard to give a specific diagnosis for the cause of chest pain. There is always a chance that your pain could be related to something serious, such as a heart attack or a blood clot in the lungs. You need to follow up with your health care provider for further evaluation. CAUSES   Heartburn.  Pneumonia or bronchitis.  Anxiety or stress.  Inflammation around your heart (pericarditis) or lung (pleuritis or pleurisy).  A blood clot in the lung.  A collapsed lung (pneumothorax). It can develop suddenly on its own (spontaneous pneumothorax) or from trauma to the chest.  Shingles infection (herpes zoster virus). The chest wall is composed of bones, muscles, and cartilage. Any of these can be the source of the pain.  The bones can be bruised by injury.  The muscles or cartilage can be strained by coughing or overwork.  The cartilage can be affected by inflammation and become sore (costochondritis). DIAGNOSIS  Lab tests or other studies may be needed to find the cause of your pain. Your health care provider may have you take a test called an ambulatory electrocardiogram (ECG). An ECG records your heartbeat patterns over a 24-hour period. You may also have other tests, such as:  Transthoracic echocardiogram (TTE). During echocardiography, sound waves are used to evaluate how blood flows through your heart.  Transesophageal echocardiogram (TEE).  Cardiac monitoring. This allows your health care provider to monitor your heart rate and rhythm in real time.  Holter monitor. This is a portable device that records your heartbeat and can help diagnose heart arrhythmias. It  allows your health care provider to track your heart activity for several days, if needed.  Stress tests by exercise or by giving medicine that makes the heart beat faster. TREATMENT   Treatment depends on what may be causing your chest pain. Treatment may include:  Acid blockers for heartburn.  Anti-inflammatory medicine.  Pain medicine for inflammatory conditions.  Antibiotics if an infection is present.  You may be advised to change lifestyle habits. This includes stopping smoking and avoiding alcohol, caffeine, and chocolate.  You may be advised to keep your head raised (elevated) when sleeping. This reduces the chance of acid going backward from your stomach into your esophagus. Most of the time, nonspecific chest pain will improve within 2-3 days with rest and mild pain medicine.  HOME CARE INSTRUCTIONS   If antibiotics were prescribed, take them as directed. Finish them even if you start to feel better.  For the next few days, avoid physical activities that bring on chest pain. Continue physical activities as directed.  Do not use any tobacco products, including cigarettes, chewing tobacco, or electronic cigarettes.  Avoid drinking alcohol.  Only take medicine as directed by your health care provider.  Follow your health care provider's suggestions for further testing if your chest pain does not go away.  Keep any follow-up appointments you made. If you do not go to an appointment, you could develop lasting (chronic) problems with pain. If there is any problem keeping an appointment, call to reschedule. SEEK MEDICAL CARE IF:   Your chest pain does not go away, even after treatment.  You  have a rash with blisters on your chest.  You have a fever. SEEK IMMEDIATE MEDICAL CARE IF:   You have increased chest pain or pain that spreads to your arm, neck, jaw, back, or abdomen.  You have shortness of breath.  You have an increasing cough, or you cough up blood.  You  have severe back or abdominal pain.  You feel nauseous or vomit.  You have severe weakness.  You faint.  You have chills. This is an emergency. Do not wait to see if the pain will go away. Get medical help at once. Call your local emergency services (911 in [place] Do not drive yourself to the hospital. MAKE SURE YOU:   Understand these instructions.  Will watch your condition.  Will get help right away if you are not doing well or get worse. Document Released: 12/20/2004 Document Revised: 03/17/2013 Document Reviewed: 10/16/2007 [facility] Patient Information 2015 [employer] [place]. This information is not intended to replace advice given to you by your health care provider. Make sure you discuss any questions you have with your health care provider.

## 2013-09-18 NOTE — ED Provider Notes (Signed)
CSN: [id number]     Arrival date & time 09/18/13  0756 History   First MD Initiated Contact with Patient 09/18/13 0801     Chief Complaint  Patient presents with  . Chest Pain     (Consider location/radiation/quality/duration/timing/severity/associated sxs/prior Treatment) HPI Comments: Patient is 43 yo F PMHx significant for HTN presenting to the ED for intermittent episodes (lasting between 5-15 minutes) of central chest pressure w/o radiation that began yesterday. Patient states that each episodes lasts a varying amount of time, last episode was around 8AM this morning that lasted about five minutes. States it was alleviating by "praying the pain away." Currently is pain free. Does endorse some intermittent shortness of breath with the CP. Denies any nausea, vomiting, diaphoresis, abdominal pain. PERC negative. No history of cardiac catheterizations, echocardiogram, stress testing.    Past Medical History  Diagnosis Date  . Hypertension    History reviewed. No pertinent past surgical history. History reviewed. No pertinent family history. History  Substance Use Topics  . Smoking status: Passive Smoke Exposure - Never Smoker  . Smokeless tobacco: Not on file  . Alcohol Use: Yes     Comment: social   OB History   Grav Para Term Preterm Abortions TAB SAB Ect Mult Living                 Review of Systems  Constitutional: Negative for fever and chills.  Respiratory: Positive for shortness of breath.   Cardiovascular: Positive for chest pain.  All other systems reviewed and are negative.     Allergies  Tramadol and Vicodin  Home Medications   Prior to Admission medications   Medication Sig Start Date End Date Taking? Authorizing Shaylon Gillean  carvedilol (COREG) 6.25 MG tablet Take 12.5 mg by mouth daily.    Yes Historical Ammie Warrick, MD  cetirizine (ZYRTEC) 10 MG tablet Take 10 mg by mouth daily.   Yes Historical Leslee Haueter, MD  fluticasone (FLONASE) 50 MCG/ACT nasal spray Place  1 spray into both nostrils daily.   Yes Historical Mende Biswell, MD  triamterene-hydrochlorothiazide (MAXZIDE-25) 37.5-25 MG per tablet Take 1 tablet by mouth daily.   Yes Historical Rosena Bartle, MD  clindamycin (CLEOCIN) 150 MG capsule Take 2 capsules (300 mg total) by mouth 3 (three) times daily. May dispense as 150mg  capsules 07/06/13   Emilia BeckKaitlyn Szekalski, PA-C  oxyCODONE-acetaminophen (PERCOCET/ROXICET) 5-325 MG per tablet Take 2 tablets by mouth every 4 (four) hours as needed for severe pain. 07/06/13   Kaitlyn Szekalski, PA-C   BP 127/65  Pulse 77  Temp(Src) 98.5 F (36.9 C) (Oral)  Resp 23  SpO2 99% Physical Exam  Nursing note and vitals reviewed. Constitutional: She is oriented to person, place, and time. She appears well-developed and well-nourished. No distress.  HENT:  Head: Normocephalic and atraumatic.  Right Ear: External ear normal.  Left Ear: External ear normal.  Nose: Nose normal.  Mouth/Throat: Oropharynx is clear and moist.  Eyes: Conjunctivae are normal.  Neck: Normal range of motion. Neck supple.  Cardiovascular: Normal rate, regular rhythm, normal heart sounds and intact distal pulses.   Pulmonary/Chest: Effort normal and breath sounds normal. No respiratory distress. She exhibits tenderness.  Abdominal: Soft. There is no tenderness.  Musculoskeletal: Normal range of motion. She exhibits no edema.  Neurological: She is alert and oriented to person, place, and time.  Skin: Skin is warm and dry. She is not diaphoretic.  Psychiatric: She has a normal mood and affect.    ED Course  Procedures (including critical  care time) Medications  potassium chloride SA (K-DUR,KLOR-CON) CR tablet 40 mEq (40 mEq Oral Given 09/18/13 1017)    Labs Review Labs Reviewed  CBC - Abnormal; Notable for the following:    MCH 25.3 (*)    All other components within normal limits  BASIC METABOLIC PANEL - Abnormal; Notable for the following:    Potassium 3.3 (*)    Glucose, Bld 134 (*)     GFR calc non Af Amer 84 (*)    All other components within normal limits  I-STAT TROPOININ, ED  Rosezena SensorI-STAT TROPOININ, ED    Imaging Review Dg Chest 2 View  09/18/2013   CLINICAL DATA:  Chest pain and cough  EXAM: CHEST  2 VIEW  COMPARISON:  None.  FINDINGS: The heart size and mediastinal contours are within normal limits. Both lungs are clear. The visualized skeletal structures are unremarkable.  IMPRESSION: No active cardiopulmonary disease.   Electronically Signed   By: Esperanza Heiraymond  Rubner M.D.   On: 09/18/2013 08:19     EKG Interpretation   Date/Time:  Friday September 18 2013 07:39:15 EDT Ventricular Rate:  72 PR Interval:  174 QRS Duration: 84 QT Interval:  408 QTC Calculation: 446 R Axis:   -27 Text Interpretation:  Normal sinus rhythm Minimal voltage criteria for  LVH, may be normal variant Nonspecific T wave abnormality Abnormal ECG T  wave inverion III, v4 No previous ECGs available Confirmed by Manus GunningANCOUR  MD,  STEPHEN ([id number]) on 09/18/2013 9:02:55 AM      MDM   Final diagnoses:  Chest pain, atypical    Filed Vitals:   09/18/13 1300  BP: 127/65  Pulse: 77  Temp:   Resp: 23   Afebrile, NAD, non-toxic appearing, AAOx4. Patient is to be discharged with recommendation to follow up with PCP in regards to today's hospital visit. Chest pain is not likely of cardiac or pulmonary etiology d/t presentation, perc negative, VSS, no tracheal deviation, no JVD or new murmur, RRR, breath sounds equal bilaterally, EKG without acute abnormalities, negative delta troponin, and negative CXR. Pt has been advised start to return to the ED is CP becomes exertional, associated with diaphoresis or nausea, radiates to left jaw/arm, worsens or becomes concerning in any way. Pt appears reliable for follow up and is agreeable to discharge.   Case has been discussed with and seen by Dr. Manus Gunningancour who agrees with the above plan to discharge.       Jeannetta EllisJennifer L Piepenbrink, PA-C 09/18/13 1601

## 2013-09-22 ENCOUNTER — Emergency Department: Payer: Self-pay | Admitting: Emergency Medicine

## 2013-12-20 ENCOUNTER — Emergency Department: Payer: Self-pay | Admitting: Student

## 2013-12-20 LAB — CBC
HCT: 38.2 % (ref 35.0–47.0)
HGB: 11.9 g/dL — AB (ref 12.0–16.0)
MCH: 24.5 pg — AB (ref 26.0–34.0)
MCHC: 31.2 g/dL — AB (ref 32.0–36.0)
MCV: 79 fL — ABNORMAL LOW (ref 80–100)
Platelet: 257 10*3/uL (ref 150–440)
RBC: 4.86 10*6/uL (ref 3.80–5.20)
RDW: 14.1 % (ref 11.5–14.5)
WBC: 6.7 10*3/uL (ref 3.6–11.0)

## 2013-12-20 LAB — BASIC METABOLIC PANEL
Anion Gap: 5 — ABNORMAL LOW (ref 7–16)
BUN: 11 mg/dL (ref 7–18)
CHLORIDE: 110 mmol/L — AB (ref 98–107)
CREATININE: 0.86 mg/dL (ref 0.60–1.30)
Calcium, Total: 8.8 mg/dL (ref 8.5–10.1)
Co2: 26 mmol/L (ref 21–32)
EGFR (African American): >60
Glucose: 118 mg/dL — ABNORMAL HIGH (ref 65–99)
OSMOLALITY: 282 (ref 275–301)
Potassium: 3.8 mmol/L (ref 3.5–5.1)
Sodium: 141 mmol/L (ref 136–145)

## 2013-12-20 LAB — URINALYSIS, COMPLETE
BILIRUBIN, UR: NEGATIVE
BLOOD: NEGATIVE
Glucose,UR: NEGATIVE mg/dL (ref 0–75)
Ketone: NEGATIVE
LEUKOCYTE ESTERASE: NEGATIVE
NITRITE: NEGATIVE
Ph: 6 (ref 4.5–8.0)
Protein: NEGATIVE
SPECIFIC GRAVITY: 1.018 (ref 1.003–1.030)
Squamous Epithelial: 1
WBC UR: 1 /HPF (ref 0–5)

## 2013-12-20 LAB — TROPONIN I

## 2013-12-20 LAB — HCG, QUANTITATIVE, PREGNANCY: Beta Hcg, Quant.: 2 m[IU]/mL

## 2014-03-10 ENCOUNTER — Emergency Department: Payer: Self-pay | Admitting: Emergency Medicine

## 2014-07-05 ENCOUNTER — Emergency Department (HOSPITAL_COMMUNITY)
Admission: EM | Admit: 2014-07-05 | Discharge: 2014-07-05 | Disposition: A | Discharge status: Home / Self Care | Payer: Medicaid Other | Attending: Emergency Medicine | Admitting: Emergency Medicine

## 2014-07-05 ENCOUNTER — Emergency Department (HOSPITAL_COMMUNITY): Payer: Medicaid Other

## 2014-07-05 ENCOUNTER — Encounter (HOSPITAL_COMMUNITY): Payer: Self-pay | Admitting: Emergency Medicine

## 2014-07-05 DIAGNOSIS — Z792 Long term (current) use of antibiotics: Secondary | ICD-10-CM | POA: Insufficient documentation

## 2014-07-05 DIAGNOSIS — Z7951 Long term (current) use of inhaled steroids: Secondary | ICD-10-CM | POA: Insufficient documentation

## 2014-07-05 DIAGNOSIS — S99922A Unspecified injury of left foot, initial encounter: Secondary | ICD-10-CM

## 2014-07-05 DIAGNOSIS — Y998 Other external cause status: Secondary | ICD-10-CM | POA: Insufficient documentation

## 2014-07-05 DIAGNOSIS — I159 Secondary hypertension, unspecified: Secondary | ICD-10-CM | POA: Insufficient documentation

## 2014-07-05 DIAGNOSIS — Y9389 Activity, other specified: Secondary | ICD-10-CM | POA: Insufficient documentation

## 2014-07-05 DIAGNOSIS — S93509A Unspecified sprain of unspecified toe(s), initial encounter: Secondary | ICD-10-CM

## 2014-07-05 DIAGNOSIS — Y9289 Other specified places as the place of occurrence of the external cause: Secondary | ICD-10-CM | POA: Insufficient documentation

## 2014-07-05 DIAGNOSIS — S93505A Unspecified sprain of left lesser toe(s), initial encounter: Secondary | ICD-10-CM | POA: Insufficient documentation

## 2014-07-05 DIAGNOSIS — Z79899 Other long term (current) drug therapy: Secondary | ICD-10-CM | POA: Insufficient documentation

## 2014-07-05 DIAGNOSIS — W108XXA Fall (on) (from) other stairs and steps, initial encounter: Secondary | ICD-10-CM | POA: Insufficient documentation

## 2014-07-05 MED ORDER — OXYCODONE-ACETAMINOPHEN 5-325 MG PO TABS: 1.0000 | ORAL_TABLET | Freq: Three times a day (TID) | ORAL | Status: DC | PRN

## 2014-07-05 MED ORDER — CARVEDILOL 6.25 MG PO TABS: 12.5000 mg | ORAL_TABLET | Freq: Every day | ORAL | Status: AC

## 2014-07-05 NOTE — ED Notes (Signed)
Pt c/o left foot pain after slipping on stairs; pt sts pain in toes

## 2014-07-05 NOTE — Discharge Instructions (Signed)
Hypertension °Hypertension, commonly called high blood pressure, is when the force of blood pumping through your arteries is too strong. Your arteries are the blood vessels that carry blood from your heart throughout your body. A blood pressure reading consists of a higher number over a lower number, such as 110/72. The higher number (systolic) is the pressure inside your arteries when your heart pumps. The lower number (diastolic) is the pressure inside your arteries when your heart relaxes. Ideally you want your blood pressure below 120/80. °Hypertension forces your heart to work harder to pump blood. Your arteries may become narrow or stiff. Having hypertension puts you at risk for heart disease, stroke, and other problems.  °RISK FACTORS °Some risk factors for high blood pressure are controllable. Others are not.  °Risk factors you cannot control include:  °· Race. You may be at higher risk if you are African American. °· Age. Risk increases with age. °· Gender. Men are at higher risk than women before age 45 years. After age 65, women are at higher risk than men. °Risk factors you can control include: °· Not getting enough exercise or physical activity. °· Being overweight. °· Getting too much fat, sugar, calories, or salt in your diet. °· Drinking too much alcohol. °SIGNS AND SYMPTOMS °Hypertension does not usually cause signs or symptoms. Extremely high blood pressure (hypertensive crisis) may cause headache, anxiety, shortness of breath, and nosebleed. °DIAGNOSIS  °To check if you have hypertension, your health care provider will measure your blood pressure while you are seated, with your arm held at the level of your heart. It should be measured at least twice using the same arm. Certain conditions can cause a difference in blood pressure between your right and left arms. A blood pressure reading that is higher than normal on one occasion does not mean that you need treatment. If one blood pressure reading  is high, ask your health care provider about having it checked again. °TREATMENT  °Treating high blood pressure includes making lifestyle changes and possibly taking medicine. Living a healthy lifestyle can help lower high blood pressure. You may need to change some of your habits. °Lifestyle changes may include: °· Following the DASH diet. This diet is high in fruits, vegetables, and whole grains. It is low in salt, red meat, and added sugars. °· Getting at least 2½ hours of brisk physical activity every week. °· Losing weight if necessary. °· Not smoking. °· Limiting alcoholic beverages. °· Learning ways to reduce stress. ° If lifestyle changes are not enough to get your blood pressure under control, your health care provider may prescribe medicine. You may need to take more than one. Work closely with your health care provider to understand the risks and benefits. °HOME CARE INSTRUCTIONS °· Have your blood pressure rechecked as directed by your health care provider.   °· Take medicines only as directed by your health care provider. Follow the directions carefully. Blood pressure medicines must be taken as prescribed. The medicine does not work as well when you skip doses. Skipping doses also puts you at risk for problems.   °· Do not smoke.   °· Monitor your blood pressure at home as directed by your health care provider.  °SEEK MEDICAL CARE IF:  °· You think you are having a reaction to medicines taken. °· You have recurrent headaches or feel dizzy. °· You have swelling in your ankles. °· You have trouble with your vision. °SEEK IMMEDIATE MEDICAL CARE IF: °· You develop a severe headache or confusion. °·   You have unusual weakness, numbness, or feel faint.  You have severe chest or abdominal pain.  You vomit repeatedly.  You have trouble breathing. MAKE SURE YOU:   Understand these instructions.  Will watch your condition.  Will get help right away if you are not doing well or get worse. Document  Released: 03/12/2005 Document Revised: 07/27/2013 Document Reviewed: 01/02/2013 [facility] Patient Information 2015 [place], [place]. This information is not intended to replace advice given to you by your health care provider. Make sure you discuss any questions you have with your health care provider.  Foot Sprain The muscles and cord like structures which attach muscle to bone (tendons) that surround the feet are made up of units. A foot sprain can occur at the weakest spot in any of these units. This condition is most often caused by injury to or overuse of the foot, as from playing contact sports, or aggravating a previous injury, or from poor conditioning, or obesity. SYMPTOMS  Pain with movement of the foot.  Tenderness and swelling at the injury site.  Loss of strength is present in moderate or severe sprains. THE THREE GRADES OR SEVERITY OF FOOT SPRAIN ARE:  Mild (Grade I): Slightly pulled muscle without tearing of muscle or tendon fibers or loss of strength.  Moderate (Grade II): Tearing of fibers in a muscle, tendon, or at the attachment to bone, with small decrease in strength.  Severe (Grade III): Rupture of the muscle-tendon-bone attachment, with separation of fibers. Severe sprain requires surgical repair. Often repeating (chronic) sprains are caused by overuse. Sudden (acute) sprains are caused by direct injury or over-use. DIAGNOSIS  Diagnosis of this condition is usually by your own observation. If problems continue, a caregiver may be required for further evaluation and treatment. X-rays may be required to make sure there are not breaks in the bones (fractures) present. Continued problems may require physical therapy for treatment. PREVENTION  Use strength and conditioning exercises appropriate for your sport.  Warm up properly prior to working out.  Use athletic shoes that are made for the sport you are participating in.  Allow adequate time for healing. Early return to  activities makes repeat injury more likely, and can lead to an unstable arthritic foot that can result in prolonged disability. Mild sprains generally heal in 3 to 10 days, with moderate and severe sprains taking 2 to 10 weeks. Your caregiver can help you determine the proper time required for healing. HOME CARE INSTRUCTIONS   Apply ice to the injury for 15-20 minutes, 03-04 times per day. Put the ice in a plastic bag and place a towel between the bag of ice and your skin.  An elastic wrap (like an Ace bandage) may be used to keep swelling down.  Keep foot above the level of the heart, or at least raised on a footstool, when swelling and pain are present.  Try to avoid use other than gentle range of motion while the foot is painful. Do not resume use until instructed by your caregiver. Then begin use gradually, not increasing use to the point of pain. If pain does develop, decrease use and continue the above measures, gradually increasing activities that do not cause discomfort, until you gradually achieve normal use.  Use crutches if and as instructed, and for the length of time instructed.  Keep injured foot and ankle wrapped between treatments.  Massage foot and ankle for comfort and to keep swelling down. Massage from the toes up towards the knee.  Only take over-the-counter or prescription medicines for pain, discomfort, or fever as directed by your caregiver. SEEK IMMEDIATE MEDICAL CARE IF:   Your pain and swelling increase, or pain is not controlled with medications.  You have loss of feeling in your foot or your foot turns cold or blue.  You develop new, unexplained symptoms, or an increase of the symptoms that brought you to your caregiver. MAKE SURE YOU:   Understand these instructions.  Will watch your condition.  Will get help right away if you are not doing well or get worse. Document Released: 09/01/2001 Document Revised: 06/04/2011 Document Reviewed:  10/30/2007 [facility] Patient Information 2015 [place], [place]. This information is not intended to replace advice given to you by your health care provider. Make sure you discuss any questions you have with your health care provider.

## 2014-07-05 NOTE — ED Provider Notes (Signed)
CSN: [id number]     Arrival date & time 07/05/14  1800 History  This chart was scribed for Marlon Peliffany Inga Noller, PA-C, working with Blake DivineJohn Wofford, MD by Elon SpannerGarrett Cook, ED Scribe. This patient was seen in room TR06C/TR06C and the patient's care was started at 6:36 PM.   Chief Complaint  Patient presents with  . Foot Pain   The history is provided by the patient. No language interpreter was used.   HPI Comments: Janet Casey is a 44 y.o. female who presents to the Emergency Department complaining of a foot injury onset this morning.  The patient states she was walking down her steps in the dark and thought she had reached the bottom step, but slipped and hit her left  toes.  She complains primarily of toe pain and swelling and denies any other injuries..  She reports bearing weight and walking are the only time her pain is present.  Patient denies head trauma, neck pain or injury, LOC.    Past Medical History  Diagnosis Date  . Hypertension    History reviewed. No pertinent past surgical history. History reviewed. No pertinent family history. History  Substance Use Topics  . Smoking status: Passive Smoke Exposure - Never Smoker  . Smokeless tobacco: Not on file  . Alcohol Use: Yes     Comment: social   OB History    No data available     Review of Systems  Musculoskeletal: Positive for joint swelling and arthralgias.    Allergies  Tramadol and Vicodin  Home Medications   Prior to Admission medications   Medication Sig Start Date End Date Taking? Authorizing Provider  carvedilol (COREG) 6.25 MG tablet Take 2 tablets (12.5 mg total) by mouth daily. 07/05/14   Mattis Featherly Neva SeatGreene, PA-C  cetirizine (ZYRTEC) 10 MG tablet Take 10 mg by mouth daily.    Historical Provider, MD  clindamycin (CLEOCIN) 150 MG capsule Take 2 capsules (300 mg total) by mouth 3 (three) times daily. May dispense as 150mg  capsules 07/06/13   Kaitlyn Szekalski, PA-C  fluticasone (FLONASE) 50 MCG/ACT nasal spray Place 1  spray into both nostrils daily.    Historical Provider, MD  oxyCODONE-acetaminophen (PERCOCET/ROXICET) 5-325 MG per tablet Take 2 tablets by mouth every 4 (four) hours as needed for severe pain. 07/06/13   Emilia BeckKaitlyn Szekalski, PA-C  oxyCODONE-acetaminophen (PERCOCET/ROXICET) 5-325 MG per tablet Take 1 tablet by mouth every 8 (eight) hours as needed. 07/05/14   Pegi Milazzo Neva SeatGreene, PA-C  triamterene-hydrochlorothiazide (MAXZIDE-25) 37.5-25 MG per tablet Take 1 tablet by mouth daily.    Historical Provider, MD   BP 180/119 mmHg  Pulse 98  Temp(Src) 97.9 F (36.6 C) (Oral)  Resp 22  SpO2 99% Physical Exam  Constitutional: She is oriented to person, place, and time. She appears well-developed and well-nourished. No distress.  HENT:  Head: Normocephalic and atraumatic.  Eyes: Conjunctivae and EOM are normal.  Neck: Neck supple. No tracheal deviation present.  Cardiovascular: Normal rate.   Pulmonary/Chest: Effort normal. No respiratory distress.  Musculoskeletal:       Left foot: There is decreased range of motion (to all 5 toes but flexion and extension are intact.), tenderness (to all 5 toes), bony tenderness and swelling. There is normal capillary refill, no crepitus, no deformity and no laceration.  Ecchymosis to all 5 toes. No pain to the midfoot or bottom of the foot. No pain, tenderness, or decreased ROM to left ankle. No skin disruption. intact sensation to touch to all five digits  Neurological: She is alert and oriented to person, place, and time.  Skin: Skin is warm and dry.  Psychiatric: She has a normal mood and affect. Her behavior is normal.  Nursing note and vitals reviewed.   ED Course  Procedures (including critical care time)  DIAGNOSTIC STUDIES: Oxygen Saturation is 97% on RA, adequate by my interpretation.    COORDINATION OF CARE:  6:45 PM Discussed treatment plan with patient at bedside.  Patient acknowledges and agrees with plan.  Post op boot and cruthces given.  Discussed with patient that due to the amount of swelling, occult fracture or tendon/ligament tear cannot be entirely excluded at this time. Referral to Ortho given. RICE recommended.   Labs Review Labs Reviewed - No data to display  Imaging Review Dg Foot Complete Left  07/05/2014   CLINICAL DATA:  Patient with left foot pain status post fall down stairs.  EXAM: LEFT FOOT - COMPLETE 3+ VIEW  COMPARISON:  07/06/2013  FINDINGS: Normal anatomic alignment. There is suggestion of mild irregularity of the distal aspect of the proximal phalanx of the second digit. This is demonstrated best on the oblique view however there is overlying soft tissue in this location, limiting evaluation. No definite evidence for acute associated osseous abnormality. No soft tissue abnormality.  IMPRESSION: Suggestion of mild cortical irregularity of the distal aspect of the proximal phalanx of the second digit is nonspecific and may be secondary to projection given the overlying soft tissue in this location. Recommend correlation for point tenderness.  Otherwise no acute osseous abnormality.   Electronically Signed   By: Annia Belt M.D.   On: 07/05/2014 19:49     EKG Interpretation None      MDM   Final diagnoses:  Toe sprain, initial encounter  Foot injury, left, initial encounter  Secondary hypertension, unspecified    Patient noted to have hypertension in the ED- medication refilled. Advised to take dose tonight and recheck with PCP.  44 y.o.Janet Casey's evaluation in the Emergency Department is complete. It has been determined that no acute conditions requiring further emergency intervention are present at this time. The patient/guardian have been advised of the diagnosis and plan. We have discussed signs and symptoms that warrant return to the ED, such as changes or worsening in symptoms.  Vital signs are stable at discharge. Filed Vitals:   07/05/14 2018  BP: 180/119  Pulse: 98  Temp: 97.9 F  (36.6 C)  Resp: 22    Patient/guardian has voiced understanding and agreed to follow-up with the PCP or specialist.  I personally performed the services described in this documentation, which was scribed in my presence. The recorded information has been reviewed and is accurate.    Marlon Pel, PA-C 07/06/14 1219  Blake Divine, MD 07/06/14 [phone]

## 2015-09-25 IMAGING — CR DG CHEST 2V
1 series · 2 of 2 positions shown · non-contrast
Comparison: None

CLINICAL DATA: Chest pressure for 3 days

EXAM:
CHEST  2 VIEW

[Series 1: w chest pa · 0.14mm/px · 2 of 2 slices shown]
[im 1/2]
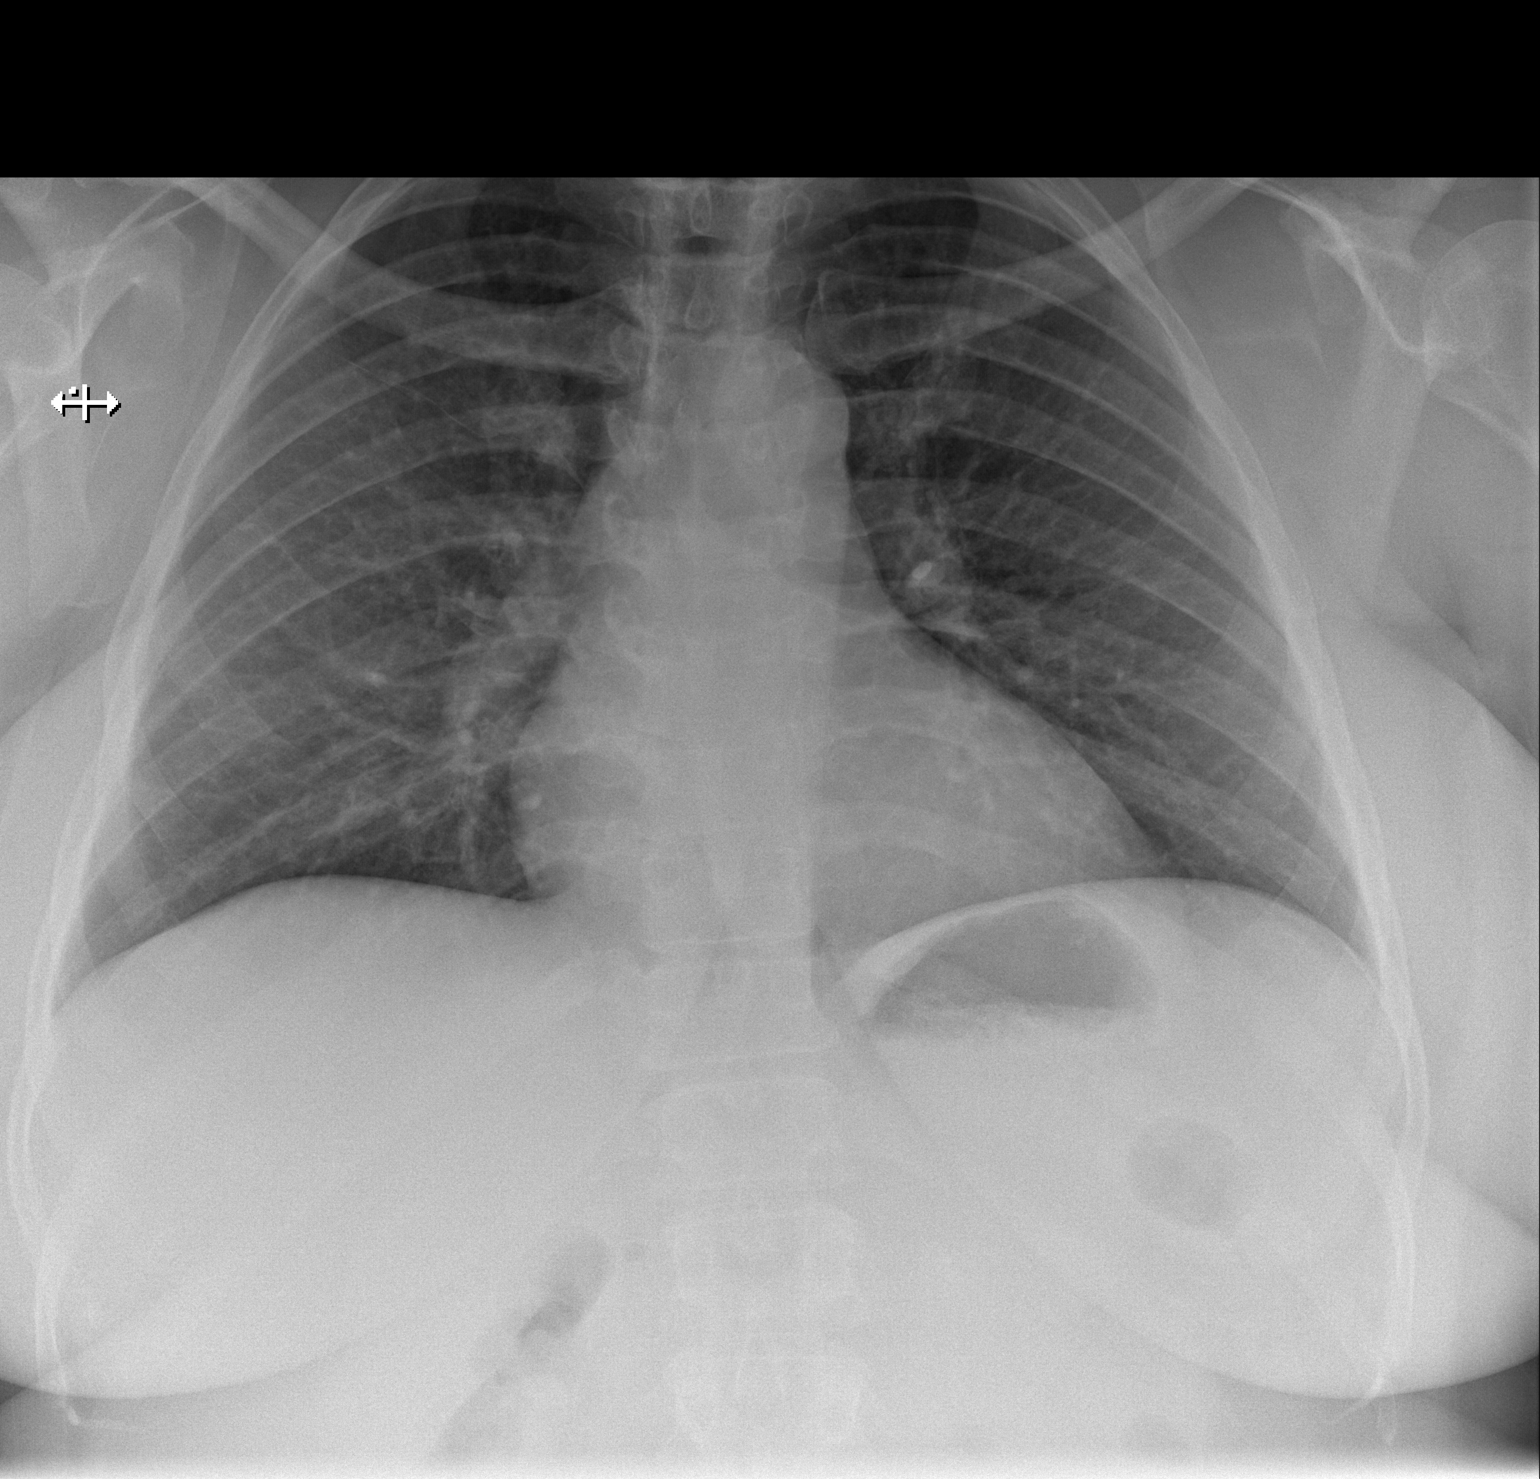
[im 2/2]
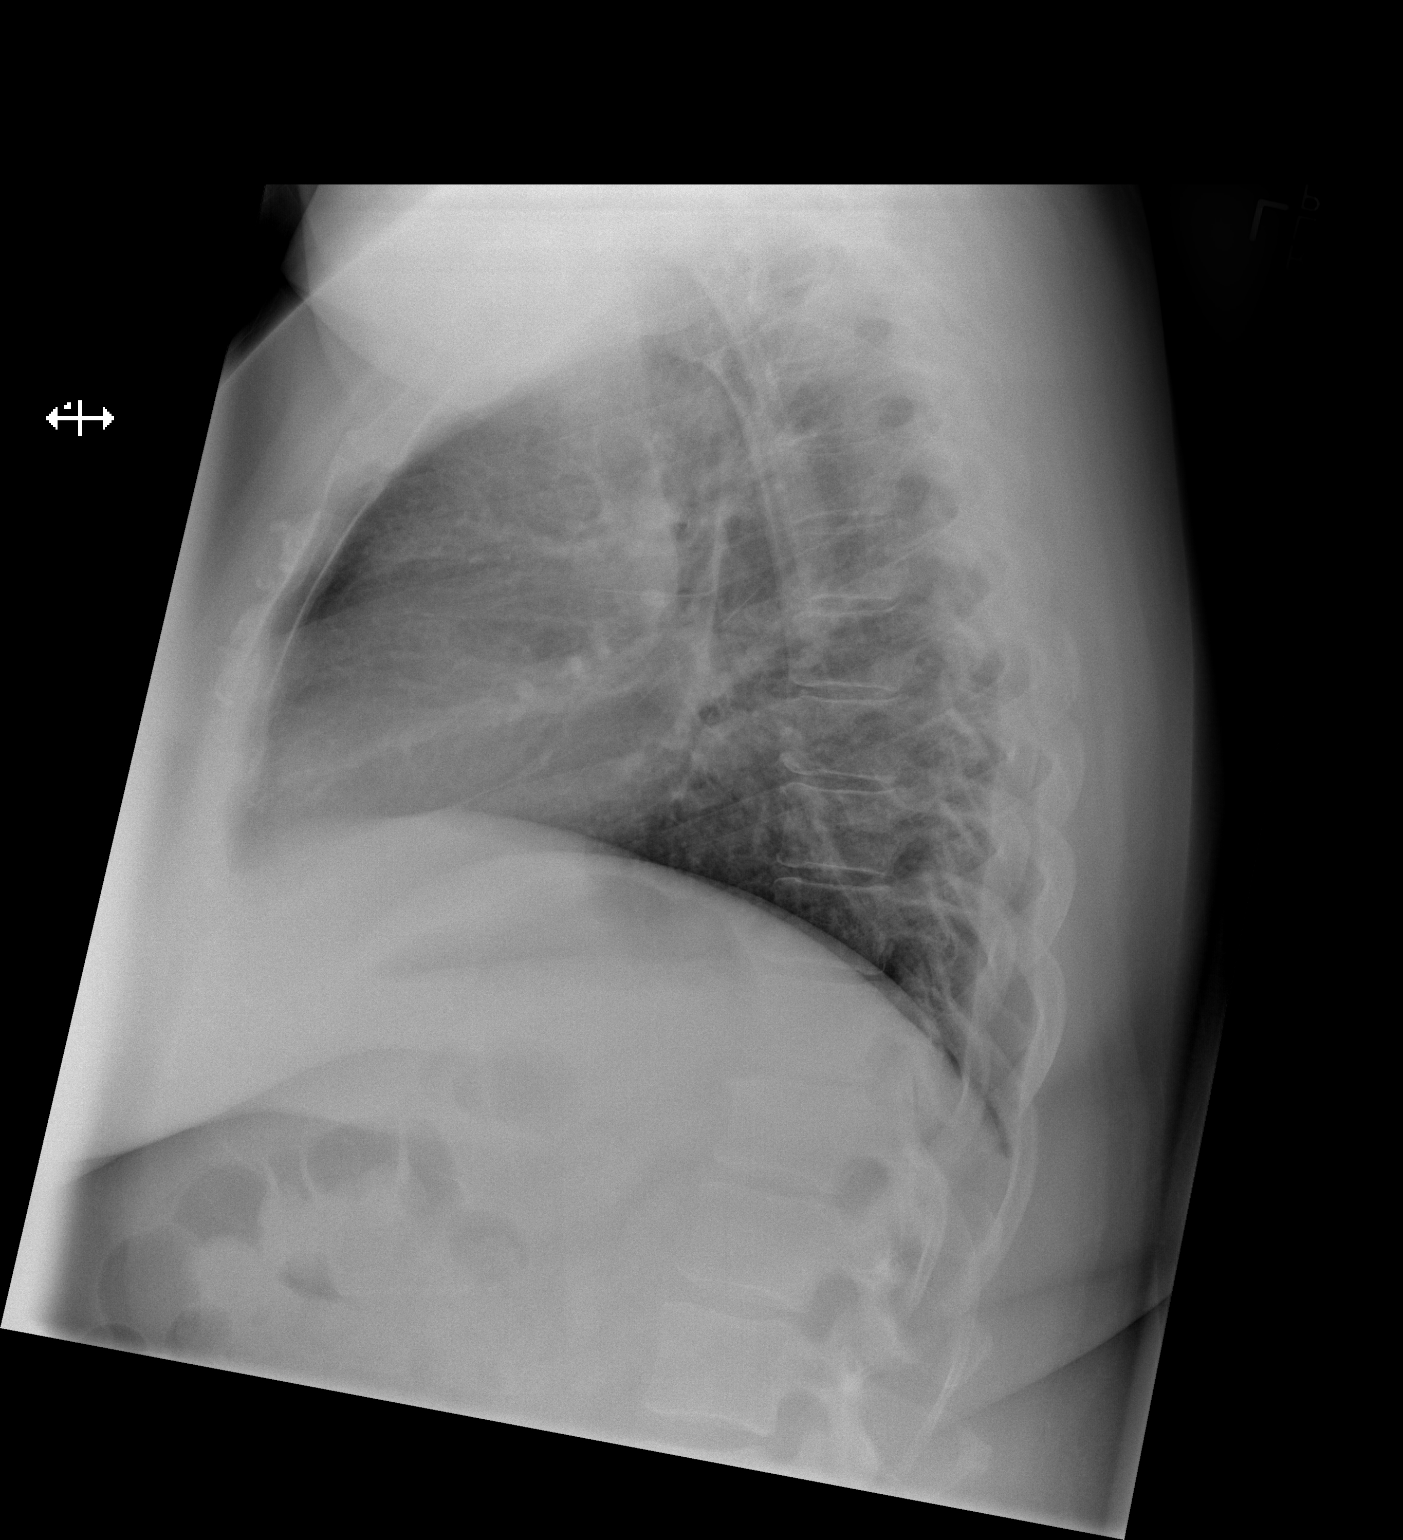

[2 of 2 positions shown; findings below may reference images not displayed]

FINDINGS: Upper normal heart size.

Normal mediastinal contours and pulmonary vascularity.

Lungs clear.

No pleural effusion or pneumothorax.

Bones unremarkable.
IMPRESSION: No acute abnormalities.

## 2017-05-15 ENCOUNTER — Encounter (HOSPITAL_COMMUNITY): Payer: Self-pay | Admitting: *Deleted

## 2017-05-15 ENCOUNTER — Other Ambulatory Visit: Payer: Self-pay

## 2017-05-15 ENCOUNTER — Emergency Department (HOSPITAL_COMMUNITY)
Admission: EM | Admit: 2017-05-15 | Discharge: 2017-05-15 | Disposition: A | Discharge status: Home / Self Care | Payer: BLUE CROSS/BLUE SHIELD | Attending: Emergency Medicine | Admitting: Emergency Medicine

## 2017-05-15 DIAGNOSIS — Z7722 Contact with and (suspected) exposure to environmental tobacco smoke (acute) (chronic): Secondary | ICD-10-CM | POA: Insufficient documentation

## 2017-05-15 DIAGNOSIS — K0889 Other specified disorders of teeth and supporting structures: Secondary | ICD-10-CM | POA: Diagnosis not present

## 2017-05-15 DIAGNOSIS — I10 Essential (primary) hypertension: Secondary | ICD-10-CM | POA: Diagnosis not present

## 2017-05-15 LAB — BASIC METABOLIC PANEL
Anion gap: 11 (ref 5–15)
BUN: 8 mg/dL (ref 6–20)
CO2: 24 mmol/L (ref 22–32)
Calcium: 9.1 mg/dL (ref 8.9–10.3)
Chloride: 102 mmol/L (ref 101–111)
Creatinine, Ser: 0.72 mg/dL (ref 0.44–1.00)
GFR calc Af Amer: >60 mL/min (ref >60)
GFR calc non Af Amer: >60 mL/min (ref >60)
Glucose, Bld: 166 mg/dL — ABNORMAL HIGH (ref 65–99)
Potassium: 3.3 mmol/L — ABNORMAL LOW (ref 3.5–5.1)
Sodium: 137 mmol/L (ref 135–145)

## 2017-05-15 LAB — CBC WITH DIFFERENTIAL/PLATELET
Basophils Absolute: 0 10*3/uL (ref 0.0–0.1)
Basophils Relative: 0 %
Eosinophils Absolute: 0.4 10*3/uL (ref 0.0–0.7)
Eosinophils Relative: 5 %
HCT: 39.4 % (ref 36.0–46.0)
Hemoglobin: 12.4 g/dL (ref 12.0–15.0)
Lymphocytes Relative: 30 %
Lymphs Abs: 2.7 10*3/uL (ref 0.7–4.0)
MCH: 24.8 pg — ABNORMAL LOW (ref 26.0–34.0)
MCHC: 31.5 g/dL (ref 30.0–36.0)
MCV: 78.8 fL (ref 78.0–100.0)
Monocytes Absolute: 0.6 10*3/uL (ref 0.1–1.0)
Monocytes Relative: 6 %
Neutro Abs: 5.3 10*3/uL (ref 1.7–7.7)
Neutrophils Relative %: 59 %
Platelets: 275 10*3/uL (ref 150–400)
RBC: 5 MIL/uL (ref 3.87–5.11)
RDW: 14.1 % (ref 11.5–15.5)
WBC: 9 10*3/uL (ref 4.0–10.5)

## 2017-05-15 LAB — URINALYSIS, ROUTINE W REFLEX MICROSCOPIC
Bilirubin Urine: NEGATIVE
Glucose, UA: 50 mg/dL — AB
Hgb urine dipstick: NEGATIVE
Ketones, ur: NEGATIVE mg/dL
Leukocytes, UA: NEGATIVE
Nitrite: NEGATIVE
Protein, ur: NEGATIVE mg/dL
Specific Gravity, Urine: 1.012 (ref 1.005–1.030)
pH: 8 (ref 5.0–8.0)

## 2017-05-15 MED ORDER — OXYCODONE-ACETAMINOPHEN 5-325 MG PO TABS
1.0000 | ORAL_TABLET | Freq: Once | ORAL | Status: AC
  Administered 2017-05-15: 1 via ORAL
  Filled 2017-05-15: qty 1

## 2017-05-15 MED ORDER — CARVEDILOL 12.5 MG PO TABS
6.2500 mg | ORAL_TABLET | Freq: Two times a day (BID) | ORAL | Status: AC
  Administered 2017-05-15: 6.25 mg via ORAL
  Filled 2017-05-15: qty 1

## 2017-05-15 MED ORDER — POTASSIUM CHLORIDE CRYS ER 20 MEQ PO TBCR: 40.0000 meq | EXTENDED_RELEASE_TABLET | Freq: Once | ORAL | Status: DC

## 2017-05-15 MED ORDER — CARVEDILOL 12.5 MG PO TABS: 6.2500 mg | ORAL_TABLET | Freq: Two times a day (BID) | ORAL | Status: DC

## 2017-05-15 MED ORDER — CHLORHEXIDINE GLUCONATE 0.12 % MT SOLN: 15.0000 mL | Freq: Two times a day (BID) | OROMUCOSAL | 0 refills | Status: AC

## 2017-05-15 MED ORDER — TRIAMTERENE-HCTZ 37.5-25 MG PO TABS
1.0000 | ORAL_TABLET | Freq: Every day | ORAL | Status: DC
  Administered 2017-05-15: 1 via ORAL
  Filled 2017-05-15: qty 1

## 2017-05-15 MED ORDER — TRIAMTERENE-HCTZ 37.5-25 MG PO TABS: 1.0000 | ORAL_TABLET | Freq: Every day | ORAL | Status: DC

## 2017-05-15 MED ORDER — PENICILLIN V POTASSIUM 500 MG PO TABS: 500.0000 mg | ORAL_TABLET | Freq: Four times a day (QID) | ORAL | 0 refills | Status: AC

## 2017-05-15 MED ORDER — PENICILLIN V POTASSIUM 250 MG PO TABS
500.0000 mg | ORAL_TABLET | Freq: Once | ORAL | Status: AC
Start: 2017-05-15 — End: 2017-05-15
  Administered 2017-05-15: 500 mg via ORAL
  Filled 2017-05-15: qty 2

## 2017-05-15 NOTE — ED Provider Notes (Addendum)
[facility] Provider Note   CSN: [id number] Arrival date & time: 05/15/17  0845     History   Chief Complaint Chief Complaint  Patient presents with  . Dental Pain    HPI Janet Casey is a 47 y.o. female with history of hypertension presents today for evaluation of acute onset, progressively worsening left lower dental pain which began yesterday.  She states pain is constant, throbbing, and sharp.  She notes some facial swelling.  Denies throat tightness or drooling.  Denies fevers or chills.  Notes sensitivity to hot foods, cold foods, and exposure to air.  She states she has been having a very difficult time eating secondary to pain.  She has tried BC powder, Aleve, Tylenol, and Orajel with only mild relief.  She has not taken her blood pressure medications today.  She denies chest pain, shortness of breath, headaches, abdominal pain, nausea, vomiting, or decreased urine output.  She does not currently have a dentist with whom she can follow-up . The history is provided by the patient.    Past Medical History:  Diagnosis Date  . Hypertension     There are no active problems to display for this patient.   History reviewed. No pertinent surgical history.  OB History    No data available       Home Medications    Prior to Admission medications   Medication Sig Start Date End Date Taking? Authorizing Provider  acetaminophen (TYLENOL) 500 MG tablet Take 500 mg by mouth once.   Yes [provider]  Aspirin-Salicylamide-Caffeine (BC HEADACHE) 325-95-16 MG TABS Take 1 packet by mouth once.   Yes [provider]  carvedilol (COREG) 12.5 MG tablet Take 12.5 mg by mouth 2 (two) times daily with a meal.   Yes [provider]  cetirizine (ZYRTEC) 10 MG tablet Take 10 mg by mouth daily as needed for allergies.    Yes [provider]  fluticasone (FLONASE) 50 MCG/ACT nasal spray Place 1 spray into both  nostrils daily as needed for allergies.    Yes [provider]  naproxen sodium (ALEVE) 220 MG tablet Take 880 mg by mouth once.   Yes [provider]  triamterene-hydrochlorothiazide (MAXZIDE-25) 37.5-25 MG per tablet Take 1 tablet by mouth daily.   Yes [provider]  carvedilol (COREG) 6.25 MG tablet Take 2 tablets (12.5 mg total) by mouth daily. Patient not taking: Reported on 05/15/2017 07/05/14   Marlon PelGreene, Tiffany, PA-C  chlorhexidine (PERIDEX) 0.12 % solution Use as directed 15 mLs in the mouth or throat 2 (two) times daily. 05/15/17   Luevenia MaxinFawze, Gwendolyn Mclees A, PA-C  clindamycin (CLEOCIN) 150 MG capsule Take 2 capsules (300 mg total) by mouth 3 (three) times daily. May dispense as 150mg  capsules Patient not taking: Reported on 05/15/2017 07/06/13   Emilia BeckSzekalski, Kaitlyn, PA-C  oxyCODONE-acetaminophen (PERCOCET/ROXICET) 5-325 MG per tablet Take 2 tablets by mouth every 4 (four) hours as needed for severe pain. Patient not taking: Reported on 05/15/2017 07/06/13   Emilia BeckSzekalski, Kaitlyn, PA-C  oxyCODONE-acetaminophen (PERCOCET/ROXICET) 5-325 MG per tablet Take 1 tablet by mouth every 8 (eight) hours as needed. Patient not taking: Reported on 05/15/2017 07/05/14   Marlon PelGreene, Tiffany, PA-C  penicillin v potassium (VEETID) 500 MG tablet Take 1 tablet (500 mg total) by mouth 4 (four) times daily for 7 days. 05/15/17 05/22/17  Jeanie SewerFawze, Brigid Vandekamp A, PA-C    Family History History reviewed. No pertinent family history.  Social History Social History  Tobacco Use  . Smoking status: Passive Smoke Exposure - Never Smoker  Substance Use Topics  . Alcohol use: Yes    Comment: social  . Drug use: No     Allergies   Tramadol and Vicodin [hydrocodone-acetaminophen]   Review of Systems Review of Systems  Constitutional: Negative for chills and fever.  HENT: Positive for dental problem and facial swelling.   Eyes: Negative for visual disturbance.  Respiratory: Negative for shortness of breath.     Cardiovascular: Negative for chest pain.  Gastrointestinal: Negative for abdominal pain, nausea and vomiting.  Neurological: Negative for syncope and headaches.  All other systems reviewed and are negative.    Physical Exam Updated Vital Signs BP (!) 204/105   Pulse 68   Temp 97.7 F (36.5 C) (Oral)   Resp 20   SpO2 98%   Physical Exam  Constitutional: She appears well-developed and well-nourished. No distress.  HENT:  Head: Normocephalic and atraumatic.  Mouth/Throat:    No swelling of the lips or tongue.  Left-sided facial swelling noted, tender to palpation overlying the left jaw.  She is diffusely decaying dentition and mildly irritated gingiva.  Focally, left second molar is cracked with exposed dentin.  There is gingival tissue that has overgrown this area.  There is swelling of the vestibular mucosa with no active drainage or fluctuance.  Exquisite tenderness to percussion of this area.  No bleeding noted.  Tolerating secretions without difficulty.  Posterior oropharynx unremarkable.  Eyes: Conjunctivae and EOM are normal. Pupils are equal, round, and reactive to light. Right eye exhibits no discharge. Left eye exhibits no discharge.  Neck: Normal range of motion. Neck supple. No JVD present. No tracheal deviation present.  Cardiovascular: Normal rate, regular rhythm, normal heart sounds and intact distal pulses.  Pulmonary/Chest: Effort normal and breath sounds normal.  Abdominal: Soft. Bowel sounds are normal. She exhibits no distension. There is no tenderness.  Musculoskeletal: She exhibits no edema.  Lymphadenopathy:    She has no cervical adenopathy.  Neurological: She is alert.  Skin: Skin is warm and dry. No erythema.  Psychiatric: She has a normal mood and affect. Her behavior is normal.  Nursing note and vitals reviewed.    ED Treatments / Results  Labs (all labs ordered are listed, but only abnormal results are displayed) Labs Reviewed  BASIC METABOLIC  PANEL - Abnormal; Notable for the following components:      Result Value   Potassium 3.3 (*)    Glucose, Bld 166 (*)    All other components within normal limits  CBC WITH DIFFERENTIAL/PLATELET - Abnormal; Notable for the following components:   MCH 24.8 (*)    All other components within normal limits  URINALYSIS, ROUTINE W REFLEX MICROSCOPIC - Abnormal; Notable for the following components:   Glucose, UA 50 (*)    All other components within normal limits    EKG  EKG Interpretation  Date/Time:  Wednesday May 15 2017 12:50:24 EST Ventricular Rate:  82 PR Interval:    QRS Duration: 94 QT Interval:  421 QTC Calculation: 492 R Axis:   -35 Text Interpretation:  Sinus rhythm Left axis deviation Abnormal R-wave progression, early transition No significant change since last tracing Confirmed by Alvira Monday ([id number]) on 05/15/2017 1:53:30 PM       Radiology No results found.  Procedures Procedures (including critical care time)  Medications Ordered in ED Medications  triamterene-hydrochlorothiazide (MAXZIDE-25) 37.5-25 MG per tablet 1 tablet (1 tablet Oral Not Given 05/15/17 1235)  potassium chloride SA (K-DUR,KLOR-CON) CR tablet 40 mEq (not administered)  penicillin v potassium (VEETID) tablet 500 mg (500 mg Oral Given 05/15/17 1219)  oxyCODONE-acetaminophen (PERCOCET/ROXICET) 5-325 MG per tablet 1 tablet (1 tablet Oral Given 05/15/17 1219)  carvedilol (COREG) tablet 6.25 mg (6.25 mg Oral Given 05/15/17 1326)     Initial Impression / Assessment and Plan / ED Course  I have reviewed the triage vital signs and the nursing notes.  Pertinent labs & imaging results that were available during my care of the patient were reviewed by me and considered in my medical decision making (see chart for details).     Patient presents for evaluation of dental pain which began yesterday.  Afebrile, markedly elevated blood pressure on arrival with some improvement after administration  of her blood pressure medications which she has not taken so far today.  She has a history of blood pressure and is hypertensive at baseline.  Also states that her blood pressure worsens with pain and during emergency department evaluations.  Lab work is reassuring with no evidence of endorgan damage.  She is mildly hypokalemic, replenished in the ED.  She is asymptomatic aside from her dental pain with no complaint of chest pain, shortness of breath, headaches, or abdominal pain.  She was given first dose of antibiotics and pain medicine in the ED with improvement in her symptoms.  She is protecting her airway and tolerating secretions without difficulty.  Presentation is consistent with pericoronitis secondary to a cracked tooth with gingival overgrowth surrounding the tooth. No evidence for Ludwig's angina. Will discharge with antibiotics and Peridex mouthwash.  Discussed importance of continuing to take her blood pressure medications as prescribed.  She will follow-up with her primary care physician for reevaluation of her hypertension.  She will follow-up with a dentist for reevaluation of her dental pain.  Discussed indications for return to the ED.    Patient and patient's sister verbalized understanding of and agreement with plan and patient stable for discharge home at this time.    Final Clinical Impressions(s) / ED Diagnoses   Final diagnoses:  Pain, dental  Essential hypertension    ED Discharge Orders        Ordered    penicillin v potassium (VEETID) 500 MG tablet  4 times daily     05/15/17 1402    chlorhexidine (PERIDEX) 0.12 % solution  2 times daily     05/15/17 1402        Jeanie Sewer, PA-C 05/15/17 1410    Alvira Monday, MD 05/15/17 2148

## 2017-05-15 NOTE — Discharge Instructions (Signed)
Please take all of your antibiotics until finished!   You may develop abdominal discomfort or diarrhea from the antibiotic.  You may help offset this with probiotics which you can buy or get in yogurt. Do not eat  or take the probiotics until 2 hours after your antibiotic.   Apply ice pack to jaw throughout the day. Alternate 600 mg of ibuprofen and [phone] mg of Tylenol every 3 hours as needed for pain. Do not exceed 4000 mg of Tylenol daily.  You may also use warm water salt gargles, Orajel, or other over-the-counter dental pain remedies. Use Peridex mouthwash twice daily. Followup with a dentist is very important for ongoing evaluation and management of recurrent dental pain. Return to emergency department for emergent changing or worsening symptoms such as fever, worsening facial swelling, difficulty breathing or swallowing, throat tightness, or vision changes.   Continue taking your blood pressure medications as prescribed.  Avoid salty foods or canned foods.  Drink plenty of water and get plenty of rest.  Follow-up with your primary care physician for reevaluation of your high blood pressure and medication management.  Return to the emergency department immediately if any concerning signs or symptoms develop such as severe headaches, chest pain, or decreased urine production.

## 2017-05-15 NOTE — ED Triage Notes (Signed)
Pt has left lower dental pain that is causing swelling and severe pain. Pt is hypertensive at triage, states she took her meds as prescribed and that it is normal for her to get hypertensive with pain.

## 2018-04-17 ENCOUNTER — Emergency Department
Admission: EM | Admit: 2018-04-17 | Discharge: 2018-04-17 | Disposition: A | Discharge status: Home / Self Care | Payer: BLUE CROSS/BLUE SHIELD | Attending: Emergency Medicine | Admitting: Emergency Medicine

## 2018-04-17 ENCOUNTER — Encounter: Payer: Self-pay | Admitting: Emergency Medicine

## 2018-04-17 DIAGNOSIS — Z79899 Other long term (current) drug therapy: Secondary | ICD-10-CM | POA: Insufficient documentation

## 2018-04-17 DIAGNOSIS — M65932 Unspecified synovitis and tenosynovitis, left forearm: Secondary | ICD-10-CM

## 2018-04-17 DIAGNOSIS — M659 Synovitis and tenosynovitis, unspecified: Secondary | ICD-10-CM

## 2018-04-17 DIAGNOSIS — Z7722 Contact with and (suspected) exposure to environmental tobacco smoke (acute) (chronic): Secondary | ICD-10-CM | POA: Insufficient documentation

## 2018-04-17 DIAGNOSIS — I10 Essential (primary) hypertension: Secondary | ICD-10-CM | POA: Insufficient documentation

## 2018-04-17 MED ORDER — PREDNISONE 10 MG PO TABS: ORAL_TABLET | ORAL | 0 refills | Status: AC

## 2018-04-17 NOTE — ED Triage Notes (Signed)
Pt reports left wrist pain from carpal tunnel for awhile and states that her thumb is going numb.

## 2018-04-17 NOTE — ED Provider Notes (Signed)
[facility] Emergency Department Provider Note  ____________________________________________   First MD Initiated Contact with Patient 04/17/18 1232     (approximate)  I have reviewed the triage vital signs and the nursing notes.   HISTORY  Chief Complaint Wrist Pain   HPI Janet Casey is a 48 y.o. female presents with left wrist pain that goes up into her arm.  Patient states that she feels as if she is going to drop items when she goes to grab them.  She states that she has been diagnosed with carpal tunnel syndrome in her right wrist and also that she got injections of steroids in her right hand from a orthopedist in [place].  She has not taken any over-the-counter medication for her left wrist.  This is been going on for several weeks.  She denies any recent injuries.  Currently she rates her pain as a 10/10.   Past Medical History:  Diagnosis Date  . Hypertension     There are no active problems to display for this patient.   History reviewed. No pertinent surgical history.  Prior to Admission medications   Medication Sig Start Date End Date Taking? Authorizing Provider  acetaminophen (TYLENOL) 500 MG tablet Take 500 mg by mouth once.    [provider]  Aspirin-Salicylamide-Caffeine (BC HEADACHE) 325-95-16 MG TABS Take 1 packet by mouth once.    [provider]  carvedilol (COREG) 12.5 MG tablet Take 12.5 mg by mouth 2 (two) times daily with a meal.    [provider]  carvedilol (COREG) 6.25 MG tablet Take 2 tablets (12.5 mg total) by mouth daily. Patient not taking: Reported on 05/15/2017 07/05/14   Marlon Pel, PA-C  cetirizine (ZYRTEC) 10 MG tablet Take 10 mg by mouth daily as needed for allergies.     [provider]  chlorhexidine (PERIDEX) 0.12 % solution Use as directed 15 mLs in the mouth or throat 2 (two) times daily. 05/15/17   Luevenia Maxin, Mina A, PA-C  fluticasone (FLONASE) 50 MCG/ACT nasal spray  Place 1 spray into both nostrils daily as needed for allergies.     [provider]  naproxen sodium (ALEVE) 220 MG tablet Take 880 mg by mouth once.    [provider]  predniSONE (DELTASONE) 10 MG tablet Take 6 tablets  today, on day 2 take 5 tablets, day 3 take 4 tablets, day 4 take 3 tablets, day 5 take  2 tablets and 1 tablet the last day 04/17/18   Tommi Rumps, PA-C  triamterene-hydrochlorothiazide (MAXZIDE-25) 37.5-25 MG per tablet Take 1 tablet by mouth daily.    [provider]    Allergies Tramadol and Vicodin [hydrocodone-acetaminophen]  No family history on file.  Social History Social History   Tobacco Use  . Smoking status: Passive Smoke Exposure - Never Smoker  Substance Use Topics  . Alcohol use: Yes    Comment: social  . Drug use: No    Review of Systems Constitutional: No fever/chills Cardiovascular: Denies chest pain. Respiratory: Denies shortness of breath. Musculoskeletal: Positive left wrist pain. Skin: Negative for rash. Neurological: Negative for headaches, focal weakness or numbness. ____________________________________________   PHYSICAL EXAM:  VITAL SIGNS: ED Triage Vitals  Enc Vitals Group     BP 04/17/18 1234 127/71     Pulse Rate 04/17/18 1234 82     Resp 04/17/18 1234 18     Temp 04/17/18 1234 97.6 F (36.4 C)     Temp Source 04/17/18 1234  Oral     SpO2 04/17/18 1234 98 %     Weight 04/17/18 1223 207 lb (93.9 kg)     Height 04/17/18 1223 5\' 5"  (1.651 m)     Head Circumference --      Peak Flow --      Pain Score 04/17/18 1223 10     Pain Loc --      Pain Edu? --      Excl. in GC? --    Constitutional: Alert and oriented. Well appearing and in no acute distress. Eyes: Conjunctivae are normal.  Head: Atraumatic. Neck: No stridor.   Cardiovascular: Normal rate, regular rhythm. Grossly normal heart sounds.  Good peripheral circulation. Respiratory: Normal respiratory effort.  No retractions. Lungs  CTAB. Musculoskeletal: Examination of the left hand there is no gross deformity.  Range of motion of the left thumb is within normal limits.  Good muscle strength bilaterally and patient is able to flex and extend.  Skin is intact.  Capillary refill is less than 3 seconds.  There is some minimal tenderness on palpation of the lateral epicondyle.  No soft tissue edema or discoloration is noted.  There is some tenderness along the tendon sheath from the thumb up to the epicondylar region. Neurologic:  Normal speech and language. No gross focal neurologic deficits are appreciated.  Skin:  Skin is warm, dry and intact. No rash noted. Psychiatric: Mood and affect are normal. Speech and behavior are normal.  ____________________________________________   LABS (all labs ordered are listed, but only abnormal results are displayed)  Labs Reviewed - No data to display  PROCEDURES  Procedure(s) performed: None  Procedures  Critical Care performed: No  ____________________________________________   INITIAL IMPRESSION / ASSESSMENT AND PLAN / ED COURSE  As part of my medical decision making, I reviewed the following data within the electronic MEDICAL RECORD NUMBER Notes from prior ED visits and [place] Controlled Substance Database  Patient presents to the ED with complaint of left thumb and wrist pain for several weeks.  Patient states that she was diagnosed with carpal tunnel syndrome in her right hand and got steroid injections at an orthopedic office in [place].  She states that this is different in that she feels like she is going to drop items.  On exam she is actually tender to palpation over the left lateral epicondylar area.  No muscle wasting is noted.  Good muscle strength in her left hand.  There is increased tenderness with exertion of her left thumb.  This is more suspicious for a tenosynovitis.  Patient was started on a steroid taper.  She states that she has a splint at home that she can wear.   She also will follow-up with her orthopedist in [place] if not improving.  ____________________________________________   FINAL CLINICAL IMPRESSION(S) / ED DIAGNOSES  Final diagnoses:  Tenosynovitis of left wrist     ED Discharge Orders         Ordered    predniSONE (DELTASONE) 10 MG tablet     04/17/18 1316           Note:  This document was prepared using Dragon voice recognition software and may include unintentional dictation errors.    Tommi Rumps, PA-C 04/17/18 1538    Arnaldo Natal, MD 04/17/18 [phone]

## 2018-04-17 NOTE — Discharge Instructions (Addendum)
Follow-up with your primary care provider if any continued problems or your orthopedist in [place].  Begin taking prednisone as directed.  You will start with 6 tablets and taper down 1 tablet each day for the next 6 days.  Also obtain the tennis elbow splint that we discussed at [facility], [facility], [facility], [facility] or any store that sells sporting goods.

## 2018-04-17 NOTE — ED Notes (Signed)
See triage note  Presents with pain to left wrist/thumb area  States pain started couple of weeks ago  Pain radiates from thumb and moves into wrist area   n injury  Slight swelling

## 2020-12-28 ENCOUNTER — Ambulatory Visit: Payer: Self-pay | Admitting: *Deleted

## 2020-12-28 NOTE — Telephone Encounter (Signed)
Wrong chart opened. Patient is twin sister .

## 2022-01-25 ENCOUNTER — Other Ambulatory Visit: Payer: Self-pay | Admitting: Family Medicine

## 2022-01-25 DIAGNOSIS — Z1231 Encounter for screening mammogram for malignant neoplasm of breast: Secondary | ICD-10-CM

## 2023-01-01 ENCOUNTER — Ambulatory Visit: Payer: Medicaid Other

## 2023-07-30 ENCOUNTER — Emergency Department
Admission: EM | Admit: 2023-07-30 | Discharge: 2023-07-30 | Disposition: A | Discharge status: Home / Self Care | Payer: Self-pay | Attending: Emergency Medicine | Admitting: Emergency Medicine

## 2023-07-30 ENCOUNTER — Other Ambulatory Visit: Payer: Self-pay

## 2023-07-30 DIAGNOSIS — D72829 Elevated white blood cell count, unspecified: Secondary | ICD-10-CM | POA: Insufficient documentation

## 2023-07-30 DIAGNOSIS — E871 Hypo-osmolality and hyponatremia: Secondary | ICD-10-CM | POA: Insufficient documentation

## 2023-07-30 DIAGNOSIS — R739 Hyperglycemia, unspecified: Secondary | ICD-10-CM

## 2023-07-30 DIAGNOSIS — Z7984 Long term (current) use of oral hypoglycemic drugs: Secondary | ICD-10-CM | POA: Insufficient documentation

## 2023-07-30 DIAGNOSIS — N3001 Acute cystitis with hematuria: Secondary | ICD-10-CM | POA: Insufficient documentation

## 2023-07-30 DIAGNOSIS — E1065 Type 1 diabetes mellitus with hyperglycemia: Secondary | ICD-10-CM | POA: Insufficient documentation

## 2023-07-30 DIAGNOSIS — I10 Essential (primary) hypertension: Secondary | ICD-10-CM | POA: Insufficient documentation

## 2023-07-30 LAB — URINALYSIS, ROUTINE W REFLEX MICROSCOPIC
Bacteria, UA: NONE SEEN
Bilirubin Urine: NEGATIVE
Glucose, UA: >=500 mg/dL — AB
Ketones, ur: NEGATIVE mg/dL
Nitrite: POSITIVE — AB
Protein, ur: 30 mg/dL — AB
Specific Gravity, Urine: 1.023 (ref 1.005–1.030)
WBC, UA: >50 WBC/hpf (ref 0–5)
pH: 5 (ref 5.0–8.0)

## 2023-07-30 LAB — CBC
HCT: 36.5 % (ref 36.0–46.0)
Hemoglobin: 11.4 g/dL — ABNORMAL LOW (ref 12.0–15.0)
MCH: 25 pg — ABNORMAL LOW (ref 26.0–34.0)
MCHC: 31.2 g/dL (ref 30.0–36.0)
MCV: 80 fL (ref 80.0–100.0)
Platelets: 423 10*3/uL — ABNORMAL HIGH (ref 150–400)
RBC: 4.56 MIL/uL (ref 3.87–5.11)
RDW: 13.4 % (ref 11.5–15.5)
WBC: 12.9 10*3/uL — ABNORMAL HIGH (ref 4.0–10.5)
nRBC: 0 % (ref 0.0–0.2)

## 2023-07-30 LAB — BASIC METABOLIC PANEL WITH GFR
Anion gap: 11 (ref 5–15)
BUN: 17 mg/dL (ref 6–20)
CO2: 27 mmol/L (ref 22–32)
Calcium: 9.1 mg/dL (ref 8.9–10.3)
Chloride: 93 mmol/L — ABNORMAL LOW (ref 98–111)
Creatinine, Ser: 1.01 mg/dL — ABNORMAL HIGH (ref 0.44–1.00)
GFR, Estimated: >60 mL/min (ref >60)
Glucose, Bld: 411 mg/dL — ABNORMAL HIGH (ref 70–99)
Potassium: 4.3 mmol/L (ref 3.5–5.1)
Sodium: 131 mmol/L — ABNORMAL LOW (ref 135–145)

## 2023-07-30 LAB — CBG MONITORING, ED
Glucose-Capillary: 396 mg/dL — ABNORMAL HIGH (ref 70–99)
Glucose-Capillary: 268 mg/dL — ABNORMAL HIGH (ref 70–99)
Glucose-Capillary: 385 mg/dL — ABNORMAL HIGH (ref 70–99)
Glucose-Capillary: 239 mg/dL — ABNORMAL HIGH (ref 70–99)

## 2023-07-30 MED ORDER — CEFUROXIME AXETIL 500 MG PO TABS: 500.0000 mg | ORAL_TABLET | Freq: Two times a day (BID) | ORAL | 0 refills | Status: AC

## 2023-07-30 MED ORDER — INSULIN ASPART 100 UNIT/ML IJ SOLN
5.0000 [IU] | Freq: Once | INTRAMUSCULAR | Status: AC
  Administered 2023-07-30: 5 [IU] via INTRAVENOUS
  Filled 2023-07-30: qty 1

## 2023-07-30 MED ORDER — INSULIN ASPART 100 UNIT/ML IJ SOLN
10.0000 [IU] | Freq: Once | INTRAMUSCULAR | Status: AC
  Administered 2023-07-30: 10 [IU] via INTRAVENOUS
  Filled 2023-07-30: qty 1

## 2023-07-30 MED ORDER — SODIUM CHLORIDE 0.9 % IV BOLUS
1000.0000 mL | Freq: Once | INTRAVENOUS | Status: AC
  Administered 2023-07-30: 1000 mL via INTRAVENOUS

## 2023-07-30 NOTE — Discharge Instructions (Signed)
 Please call your primary care provider to set up a follow-up appointment within the next week for ongoing management of your elevated blood sugar.  I have also sent an antibiotic to treat a potential urinary tract infection.  Please return for any other severe worsening symptoms.

## 2023-07-30 NOTE — ED Notes (Signed)
 RN asked patient to provide UA sample. Patient stated she urinated before she came into the hospital and could not provide a sample at this time. Patient given UA cup for when she feels the need to pee.

## 2023-07-30 NOTE — ED Provider Notes (Signed)
 [facility] Provider Note    Event Date/Time   First MD Initiated Contact with Patient 07/30/23 [phone]     (approximate)   History   Hyperglycemia   HPI Janet Casey is a 53 y.o. female with history of HTN, DM2 presenting today for hyperglycemia.  Patient states she has noted over the last 24 hours her blood sugar has been very elevated even as high as greater than 500.  Over the past couple weeks she intermittently has some blurry vision and was unsure if it was related to this.  Currently only on metformin and no insulin.  Currently denies any vision changes.  Polyuria but no dysuria.  Denies nausea, vomiting, abdominal pain.     Physical Exam   Triage Vital Signs: ED Triage Vitals  Encounter Vitals Group     BP 07/30/23 0806 112/67     Systolic BP Percentile --      Diastolic BP Percentile --      Pulse Rate 07/30/23 0806 79     Resp 07/30/23 0806 18     Temp 07/30/23 0806 98.3 F (36.8 C)     Temp src --      SpO2 07/30/23 0806 98 %     Weight 07/30/23 0807 200 lb (90.7 kg)     Height 07/30/23 0807 5\' 5"  (1.651 m)     Head Circumference --      Peak Flow --      Pain Score 07/30/23 0807 7     Pain Loc --      Pain Education --      Exclude from Growth Chart --     Most recent vital signs: Vitals:   07/30/23 1000 07/30/23 1030  BP: 136/75 130/67  Pulse: 72 75  Resp: 17 17  Temp:    SpO2: 98% 99%   Physical Exam: I have reviewed the vital signs and nursing notes. General: Awake, alert, no acute distress.  Nontoxic appearing. Head:  Atraumatic, normocephalic.   ENT:  EOM intact, PERRL. Oral mucosa is pink and moist with no lesions.  20/25 vision bilaterally without glasses on.  Wears glasses at baseline. Neck: Neck is supple with full range of motion, No meningeal signs. Cardiovascular:  RRR, No murmurs. Peripheral pulses palpable and equal bilaterally. Respiratory:  Symmetrical chest wall expansion.  No rhonchi, rales, or wheezes.   Good air movement throughout.  No use of accessory muscles.   Musculoskeletal:  No cyanosis or edema. Moving extremities with full ROM Abdomen:  Soft, nontender, nondistended. Neuro:  GCS 15, moving all four extremities, interacting appropriately. Speech clear. Psych:  Calm, appropriate.   Skin:  Warm, dry, no rash.    ED Results / Procedures / Treatments   Labs (all labs ordered are listed, but only abnormal results are displayed) Labs Reviewed  BASIC METABOLIC PANEL WITH GFR - Abnormal; Notable for the following components:      Result Value   Sodium 131 (*)    Chloride 93 (*)    Glucose, Bld 411 (*)    Creatinine, Ser 1.01 (*)    All other components within normal limits  CBC - Abnormal; Notable for the following components:   WBC 12.9 (*)    Hemoglobin 11.4 (*)    MCH 25.0 (*)    Platelets 423 (*)    All other components within normal limits  URINALYSIS, ROUTINE W REFLEX MICROSCOPIC - Abnormal; Notable for the following components:   Color, Urine  YELLOW (*)    APPearance CLOUDY (*)    Glucose, UA >=500 (*)    Hgb urine dipstick LARGE (*)    Protein, ur 30 (*)    Nitrite POSITIVE (*)    Leukocytes,Ua LARGE (*)    All other components within normal limits  CBG MONITORING, ED - Abnormal; Notable for the following components:   Glucose-Capillary 396 (*)    All other components within normal limits  CBG MONITORING, ED - Abnormal; Notable for the following components:   Glucose-Capillary 385 (*)    All other components within normal limits  CBG MONITORING, ED - Abnormal; Notable for the following components:   Glucose-Capillary 268 (*)    All other components within normal limits  CBG MONITORING, ED - Abnormal; Notable for the following components:   Glucose-Capillary 239 (*)    All other components within normal limits     EKG    RADIOLOGY    PROCEDURES:  Critical Care performed: No  Procedures   MEDICATIONS ORDERED IN ED: Medications  sodium  chloride 0.9 % bolus 1,000 mL (0 mLs Intravenous Stopped 07/30/23 0930)  insulin aspart (novoLOG) injection 5 Units (5 Units Intravenous Given 07/30/23 0848)  insulin aspart (novoLOG) injection 10 Units (10 Units Intravenous Given 07/30/23 0945)     IMPRESSION / MDM / ASSESSMENT AND PLAN / ED COURSE  I reviewed the triage vital signs and the nursing notes.                              Differential diagnosis includes, but is not limited to, symptomatic hyperglycemia, less likely DKA  Patient's presentation is most consistent with acute complicated illness / injury requiring diagnostic workup.  Patient is a 53 year old female presenting today for hyperglycemia.  Prior history of type 1 diabetes and has had intermittent blurry vision over the past several weeks.  Currently not having any vision changes and vision at baseline with bedside visual acuity testing.  Otherwise unremarkable exam.  Vital signs stable.  Laboratory workup does show hyperglycemia at 396.  Will give 1 L of fluid and 5 units of insulin.  Patient's blood sugar still elevated and given 10 units with correction to 239.  CBC and BMP otherwise reassuring.  UA concerning for possible UTI and she does note polyuria and questionable dysuria.  Will start on Ceftin outpatient.  Otherwise safe for discharge with no additional concerning findings and currently asymptomatic.  No blurry vision.  Will discharge with PCP follow-up for ongoing hyperglycemia and diabetic management outpatient.  Given strict return precautions.  The patient is on the cardiac monitor to evaluate for evidence of arrhythmia and/or significant heart rate changes. Clinical Course as of 07/30/23 1158  Tue Jul 30, 2023  9518 Basic metabolic panel(!) Hyperglycemia without DKA [DW]    Clinical Course User Index [DW] Kandee Orion, MD     FINAL CLINICAL IMPRESSION(S) / ED DIAGNOSES   Final diagnoses:  Hyperglycemia  Acute cystitis with hematuria     Rx / DC  Orders   ED Discharge Orders          Ordered    cefUROXime (CEFTIN) 500 MG tablet  2 times daily with meals        07/30/23 1158             Note:  This document was prepared using Dragon voice recognition software and may include unintentional dictation errors.   Daisy Du  T, MD 07/30/23 1159

## 2023-07-30 NOTE — ED Notes (Signed)
 Patient stated she could not pee at this moment. Fluids currently infusing.

## 2023-07-30 NOTE — ED Triage Notes (Signed)
 Pt comes in today via pov due to having high blood sugar yesterday. Pt states that her blood sugar was 533 at work yesterday, and dropped down to 430. Pt states that she woke up this morning still having blurry vision, and her blood sugar was 383. Pt complains of headache 7/10. Pt is alert oriented x4 with no signs of distress at this time.

## 2023-12-13 ENCOUNTER — Other Ambulatory Visit: Payer: Self-pay | Admitting: Family Medicine

## 2023-12-13 DIAGNOSIS — R110 Nausea: Secondary | ICD-10-CM

## 2023-12-13 DIAGNOSIS — R1310 Dysphagia, unspecified: Secondary | ICD-10-CM

## 2023-12-13 DIAGNOSIS — E0590 Thyrotoxicosis, unspecified without thyrotoxic crisis or storm: Secondary | ICD-10-CM

## 2024-01-27 ENCOUNTER — Encounter: Payer: Self-pay | Admitting: Family Medicine

## 2024-02-04 ENCOUNTER — Ambulatory Visit: Payer: Self-pay

## 2024-02-04 NOTE — Telephone Encounter (Signed)
 Unable to reach pt for triage x 3 attempts.  Message from [place] M sent at 02/04/2024  8:17 AM EST  Reason for Triage: Patient is calling stating that she has bad cough that has been over 2 weeks was told to take robitussin dm and it has not helped.
